# Patient Record
Sex: Female | Born: 1940 | Race: White | Hispanic: No | Marital: Married | Smoking: Never smoker
Health system: Southern US, Community
[De-identification: ages and names within clinical notes are randomized; demographics above are authoritative.]

## PROBLEM LIST (undated history)

## (undated) DIAGNOSIS — E785 Hyperlipidemia, unspecified: Secondary | ICD-10-CM

## (undated) DIAGNOSIS — K219 Gastro-esophageal reflux disease without esophagitis: Secondary | ICD-10-CM

## (undated) DIAGNOSIS — N189 Chronic kidney disease, unspecified: Secondary | ICD-10-CM

## (undated) DIAGNOSIS — M810 Age-related osteoporosis without current pathological fracture: Secondary | ICD-10-CM

## (undated) DIAGNOSIS — R413 Other amnesia: Principal | ICD-10-CM

## (undated) DIAGNOSIS — T8859XA Other complications of anesthesia, initial encounter: Secondary | ICD-10-CM

## (undated) DIAGNOSIS — T4145XA Adverse effect of unspecified anesthetic, initial encounter: Secondary | ICD-10-CM

## (undated) HISTORY — PX: TONSILLECTOMY: SUR1361

## (undated) HISTORY — DX: Other amnesia: R41.3

---

## 1982-12-03 HISTORY — PX: ABDOMINAL HYSTERECTOMY: SHX81

## 2000-01-29 ENCOUNTER — Other Ambulatory Visit: Admission: RE | Admit: 2000-01-29 | Discharge: 2000-01-29 | Payer: Self-pay | Admitting: Obstetrics and Gynecology

## 2000-08-12 ENCOUNTER — Encounter: Payer: Self-pay | Admitting: Obstetrics and Gynecology

## 2000-08-12 ENCOUNTER — Encounter: Admission: RE | Admit: 2000-08-12 | Discharge: 2000-08-12 | Payer: Self-pay | Admitting: Obstetrics and Gynecology

## 2001-01-29 ENCOUNTER — Other Ambulatory Visit: Admission: RE | Admit: 2001-01-29 | Discharge: 2001-01-29 | Payer: Self-pay | Admitting: Obstetrics and Gynecology

## 2001-07-29 ENCOUNTER — Ambulatory Visit (HOSPITAL_COMMUNITY): Admission: RE | Admit: 2001-07-29 | Discharge: 2001-07-29 | Payer: Self-pay | Admitting: Gastroenterology

## 2001-12-07 ENCOUNTER — Encounter: Payer: Self-pay | Admitting: Emergency Medicine

## 2001-12-07 ENCOUNTER — Emergency Department (HOSPITAL_COMMUNITY): Admission: EM | Admit: 2001-12-07 | Discharge: 2001-12-07 | Payer: Self-pay | Admitting: Emergency Medicine

## 2002-04-17 ENCOUNTER — Other Ambulatory Visit: Admission: RE | Admit: 2002-04-17 | Discharge: 2002-04-17 | Payer: Self-pay | Admitting: Gynecology

## 2003-06-25 ENCOUNTER — Encounter: Payer: Self-pay | Admitting: Gynecology

## 2003-06-25 ENCOUNTER — Encounter: Admission: RE | Admit: 2003-06-25 | Discharge: 2003-06-25 | Payer: Self-pay | Admitting: Gynecology

## 2004-06-13 ENCOUNTER — Other Ambulatory Visit: Admission: RE | Admit: 2004-06-13 | Discharge: 2004-06-13 | Payer: Self-pay | Admitting: Gynecology

## 2004-06-17 ENCOUNTER — Emergency Department (HOSPITAL_COMMUNITY): Admission: EM | Admit: 2004-06-17 | Discharge: 2004-06-17 | Payer: Self-pay | Admitting: Emergency Medicine

## 2004-06-22 ENCOUNTER — Ambulatory Visit (HOSPITAL_COMMUNITY): Admission: RE | Admit: 2004-06-22 | Discharge: 2004-06-22 | Payer: Self-pay | Admitting: Gastroenterology

## 2005-03-28 ENCOUNTER — Ambulatory Visit: Payer: Self-pay | Admitting: Family Medicine

## 2005-06-13 ENCOUNTER — Other Ambulatory Visit: Admission: RE | Admit: 2005-06-13 | Discharge: 2005-06-13 | Payer: Self-pay | Admitting: Gynecology

## 2005-08-07 ENCOUNTER — Ambulatory Visit: Payer: Self-pay | Admitting: Family Medicine

## 2006-02-14 ENCOUNTER — Ambulatory Visit: Payer: Self-pay | Admitting: Family Medicine

## 2006-02-16 ENCOUNTER — Emergency Department (HOSPITAL_COMMUNITY): Admission: EM | Admit: 2006-02-16 | Discharge: 2006-02-16 | Payer: Self-pay | Admitting: *Deleted

## 2006-04-16 ENCOUNTER — Ambulatory Visit: Payer: Self-pay | Admitting: Family Medicine

## 2006-04-22 ENCOUNTER — Ambulatory Visit (HOSPITAL_COMMUNITY): Admission: RE | Admit: 2006-04-22 | Discharge: 2006-04-22 | Payer: Self-pay | Admitting: Family Medicine

## 2006-05-14 ENCOUNTER — Ambulatory Visit: Payer: Self-pay | Admitting: Family Medicine

## 2006-06-04 ENCOUNTER — Ambulatory Visit: Payer: Self-pay | Admitting: Family Medicine

## 2006-06-10 ENCOUNTER — Ambulatory Visit (HOSPITAL_COMMUNITY): Admission: RE | Admit: 2006-06-10 | Discharge: 2006-06-10 | Payer: Self-pay | Admitting: Family Medicine

## 2006-06-27 ENCOUNTER — Ambulatory Visit: Payer: Self-pay | Admitting: Family Medicine

## 2006-07-15 ENCOUNTER — Ambulatory Visit: Payer: Self-pay | Admitting: Family Medicine

## 2006-08-19 ENCOUNTER — Ambulatory Visit: Payer: Self-pay | Admitting: Family Medicine

## 2006-09-27 ENCOUNTER — Ambulatory Visit: Payer: Self-pay | Admitting: Family Medicine

## 2006-10-04 ENCOUNTER — Ambulatory Visit: Payer: Self-pay | Admitting: Family Medicine

## 2006-11-07 ENCOUNTER — Ambulatory Visit: Payer: Self-pay | Admitting: Family Medicine

## 2006-12-31 ENCOUNTER — Ambulatory Visit: Payer: Self-pay | Admitting: Family Medicine

## 2007-03-03 ENCOUNTER — Ambulatory Visit: Payer: Self-pay | Admitting: Family Medicine

## 2007-04-17 ENCOUNTER — Ambulatory Visit: Payer: Self-pay | Admitting: Family Medicine

## 2007-07-10 ENCOUNTER — Other Ambulatory Visit: Admission: RE | Admit: 2007-07-10 | Discharge: 2007-07-10 | Payer: Self-pay | Admitting: Gynecology

## 2009-03-01 ENCOUNTER — Encounter: Admission: RE | Admit: 2009-03-01 | Discharge: 2009-03-01 | Payer: Self-pay | Admitting: Surgery

## 2009-03-02 ENCOUNTER — Encounter: Admission: RE | Admit: 2009-03-02 | Discharge: 2009-03-02 | Payer: Self-pay | Admitting: Surgery

## 2009-08-04 ENCOUNTER — Encounter: Admission: RE | Admit: 2009-08-04 | Discharge: 2009-08-04 | Payer: Self-pay | Admitting: Gynecology

## 2010-08-09 ENCOUNTER — Encounter: Admission: RE | Admit: 2010-08-09 | Discharge: 2010-08-09 | Payer: Self-pay | Admitting: Surgery

## 2011-04-20 NOTE — Op Note (Signed)
NAME:  Diane Wise, Diane Wise                         ACCOUNT NO.:  [id number]   MEDICAL RECORD NO.:  [id number]                   PATIENT TYPE:  AMB   LOCATION:  ENDO                                 FACILITY:  [facility]   PHYSICIAN:  James L. Malon Kindle., M.D.          DATE OF BIRTH:  Nov 27, 1941   DATE OF PROCEDURE:  06/22/2004  DATE OF DISCHARGE:                                 OPERATIVE REPORT   PROCEDURE:  Esophagogastroduodenoscopy.   ENDOSCOPIST:  Llana Aliment. Randa Evens, M.D.   MEDICATIONS:  Cetacaine spray, fentanyl 50 mcg and Versed 4 mg IV.   INDICATION:  Chest pain and vague epigastric pain refractory to Pepcid.   DESCRIPTION OF PROCEDURE:  Procedure had been explained to the patient and  consent obtained.  With the patient in the left lateral decubitus position,  the Olympus scope was inserted and advanced.  We were able to advance easily  into the stomach.  The pyloric channel was identified and passed.  The  duodenum including the bulb and second portion was seen well.  The duodenal  bulb was quite scarred but there were no active ulcerations and had the  appearance of a patient with previous duodenal ulcers.  The pyloric channel  was grossly normal.  The antrum and body of the stomach were normal and free  of ulceration.  The fundus and cardia were seen well on the retroflexed view  and appeared normal.  There was a 2- to 3-cm hiatal hernia with a ring at  the GE junction.  The distal and proximal esophagus was endoscopically  normal.  Scope was withdrawn.  The patient tolerated the procedure well,  maintained on low-flow oxygen and pulse oximetry throughout the procedure.   ASSESSMENT:  1. Scarred duodenal bulb.  2. Probable esophageal reflux could be the cause of the chest pain, 530.81.   PLAN:  We will give Aciphex daily, give reflux instructions.  She will see  Dr. Molly Maduro Buccini in the office in 3-4 weeks.                                               James L. Malon Kindle.,  M.D.    Waldron Session  D:  06/22/2004  T:  06/22/2004  Job:  [id number]   cc:   Colon Flattery, D.O.  [address]  [place]  [place] [address]  Fax: [phone]   Bernette Redbird, M.D.  [address] [place]  [place], [place] [address]  Fax: [phone]

## 2011-04-20 NOTE — Procedures (Signed)
[facility]  Patient:    CARMYN, HAMM Visit Number: [id number] MRN: [id number]          Service Type: END Location: ENDO Attending Physician:  Rich Brave Dictated by:   Florencia Reasons, M.D. Proc. Date: 07/29/01 Admit Date:  07/29/2001 Discharge Date: 07/29/2001                             Procedure Report  PROCEDURE PERFORMED:  Colonoscopy.  ENDOSCOPIST:  Florencia Reasons, M.D.  INDICATIONS FOR PROCEDURE:  Rectal bleeding.  FINDINGS:  Normal exam to the cecum.  DESCRIPTION OF PROCEDURE:  The nature, purpose and risks of the procedure had been discussed with the patient, who provided written consent.  Sedation was fentanyl 70 mcg and Versed 7 mg IV.  The colonoscope was advanced to the cecum and pullback was then performed.  No abnormalities were identified during this exam.  Specifically, I saw no evidence of polyps, cancer, colitis, vascular malformations or diverticulosis. No definite source of the patients rectal bleeding was identified.  There were no evident complications to the procedure.  IMPRESSION:  Normal colonoscopy.  PLAN: Follow-up colonoscopy anticipated in about five years. Dictated by:   Florencia Reasons, M.D. Attending Physician:  Rich Brave DD:  08/21/01 TD:  08/21/01 Job: [id number]

## 2011-04-26 ENCOUNTER — Encounter (INDEPENDENT_AMBULATORY_CARE_PROVIDER_SITE_OTHER): Payer: Self-pay | Admitting: Surgery

## 2011-06-27 ENCOUNTER — Other Ambulatory Visit (INDEPENDENT_AMBULATORY_CARE_PROVIDER_SITE_OTHER): Payer: Self-pay | Admitting: Surgery

## 2011-06-27 DIAGNOSIS — Z1231 Encounter for screening mammogram for malignant neoplasm of breast: Secondary | ICD-10-CM

## 2011-08-17 ENCOUNTER — Ambulatory Visit
Admission: RE | Admit: 2011-08-17 | Discharge: 2011-08-17 | Disposition: A | Discharge status: Home / Self Care | Payer: Medicare Other | Source: Ambulatory Visit | Attending: Surgery | Admitting: Surgery

## 2011-08-17 DIAGNOSIS — Z1231 Encounter for screening mammogram for malignant neoplasm of breast: Secondary | ICD-10-CM

## 2012-07-08 ENCOUNTER — Other Ambulatory Visit: Payer: Self-pay | Admitting: Gynecology

## 2012-07-08 DIAGNOSIS — Z1231 Encounter for screening mammogram for malignant neoplasm of breast: Secondary | ICD-10-CM

## 2012-08-15 ENCOUNTER — Ambulatory Visit
Admission: RE | Admit: 2012-08-15 | Discharge: 2012-08-15 | Disposition: A | Discharge status: Home / Self Care | Payer: Medicare Other | Source: Ambulatory Visit | Attending: Gynecology | Admitting: Gynecology

## 2012-08-15 DIAGNOSIS — Z1231 Encounter for screening mammogram for malignant neoplasm of breast: Secondary | ICD-10-CM

## 2013-02-13 ENCOUNTER — Encounter (INDEPENDENT_AMBULATORY_CARE_PROVIDER_SITE_OTHER): Payer: Self-pay | Admitting: General Surgery

## 2013-02-13 ENCOUNTER — Ambulatory Visit (INDEPENDENT_AMBULATORY_CARE_PROVIDER_SITE_OTHER): Payer: Medicare Other | Admitting: General Surgery

## 2013-02-13 VITALS — BP 118/64 | HR 85 | Temp 98.5°F | Resp 18 | Ht 64.0 in | Wt 136.8 lb

## 2013-02-13 DIAGNOSIS — K635 Polyp of colon: Secondary | ICD-10-CM

## 2013-02-13 DIAGNOSIS — D126 Benign neoplasm of colon, unspecified: Secondary | ICD-10-CM

## 2013-02-13 DIAGNOSIS — Z860100 Personal history of colon polyps, unspecified: Secondary | ICD-10-CM | POA: Insufficient documentation

## 2013-02-13 NOTE — Patient Instructions (Addendum)
We will schedule an appendectomy at your convenience.  Call the office when you are ready to schedule

## 2013-02-13 NOTE — Progress Notes (Signed)
Chief Complaint  Patient presents with  . New Evaluation    eval adenoma    HISTORY: Diane Wise is a 72 y.o. female who presents to the office with a cecal polyp at the appendiceal orifice.  Biopsy revealed tubular adenoma with no HGD. This was found on previous colonoscopy and resected then as well but was larger and still present on repeat biopsy.  She denies abd pain, melena and weight loss.  She had a hysterectomy in 1984, and she thinks they may have done an appendectomy at that time.  History reviewed. No pertinent past medical history.  High cholesterol  Past Surgical History  Procedure Laterality Date  . Tonsillectomy    . Abdominal hysterectomy  1984      Current Outpatient Prescriptions  Medication Sig Dispense Refill  . aspirin 325 MG EC tablet Take 325 mg by mouth daily.      . cyanocobalamin 100 MCG tablet Take 100 mcg by mouth daily.      . Flaxseed, Linseed, (FLAX SEED OIL PO) Take by mouth.      . Multiple Vitamin (MULTIVITAMIN) capsule Take 1 capsule by mouth daily.        Maxwell Caul Bicarbonate (ZEGERID) 20-1100 MG CAPS Take 1 capsule by mouth daily before breakfast.      . simvastatin (ZOCOR) 20 MG tablet Take 20 mg by mouth at bedtime.         No current facility-administered medications for this visit.      Allergies  Allergen Reactions  . Codeine Nausea And Vomiting  . Sulfa Antibiotics Nausea And Vomiting      Family History  Problem Relation Age of Onset  . Cancer Father   . Diabetes Sister   . Diabetes Brother       History   Social History  . Marital Status: Married    Spouse Name: N/A    Number of Children: N/A  . Years of Education: N/A   Social History Main Topics  . Smoking status: Never Smoker   . Smokeless tobacco: Never Used  . Alcohol Use: No  . Drug Use: None  . Sexually Active: None   Other Topics Concern  . None   Social History Narrative  . None       REVIEW OF SYSTEMS - PERTINENT POSITIVES  ONLY: Review of Systems - General ROS: negative for - chills or fever Hematological and Lymphatic ROS: negative for - bleeding problems, blood clots or bruising Respiratory ROS: no cough, shortness of breath, or wheezing Cardiovascular ROS: no chest pain or dyspnea on exertion Gastrointestinal ROS: no abdominal pain, change in bowel habits, or black or bloody stools  EXAM: Filed Vitals:   02/13/13 1525  BP: 118/64  Pulse: 85  Temp: 98.5 F (36.9 C)  Resp: 18    Gen:  No acute distress.  Well nourished and well groomed.   Neurological: Alert and oriented to person, place, and time. Coordination normal.  Head: Normocephalic and atraumatic.  Eyes: Conjunctivae are normal. Pupils are equal, round, and reactive to light. No scleral icterus.  Neck: Normal range of motion. Neck supple. No tracheal deviation or thyromegaly present.  No cervical lymphadenopathy. Cardiovascular: Normal rate, regular rhythm, normal heart sounds and intact distal pulses.  Exam reveals no gallop and no friction rub.  No murmur heard. Respiratory: Effort normal.  No respiratory distress. No chest wall tenderness. Breath sounds normal.  No wheezes, rales or rhonchi.  GI: Soft. Bowel sounds are normal.  The abdomen is soft and nontender.  There is no rebound and no guarding.  Musculoskeletal: Normal range of motion. Extremities are nontender.  Skin: Skin is warm and dry. No rash noted. No diaphoresis. No erythema. No pallor. No clubbing, cyanosis, or edema.   Psychiatric: Normal mood and affect. Behavior is normal. Judgment and thought content normal.     ASSESSMENT AND PLAN: Diane Wise is a 72 y.o. F with a polyp noted at appendiceal orifice.  This was not amendable to endoscopic resection due to location.  Her options are to continue endoscopic surveillance or surgical resection.  She has elected for surgical resection.  We will attempt to get records of hysterectomy and plan for l/s appendectomy at her  conveinence.  I will ask her medical doctor to provide preoperative clearance.   Vanita Panda, MD Colon and Rectal Surgery / General Surgery [facility]      Visit Diagnoses: 1. Personal history of colonic polyps     Primary Care Physician: Josue Hector, MD

## 2013-02-17 ENCOUNTER — Encounter (INDEPENDENT_AMBULATORY_CARE_PROVIDER_SITE_OTHER): Payer: Self-pay

## 2013-03-02 ENCOUNTER — Encounter (INDEPENDENT_AMBULATORY_CARE_PROVIDER_SITE_OTHER): Payer: Self-pay

## 2013-03-06 ENCOUNTER — Encounter (INDEPENDENT_AMBULATORY_CARE_PROVIDER_SITE_OTHER): Payer: Self-pay

## 2013-05-15 ENCOUNTER — Encounter (INDEPENDENT_AMBULATORY_CARE_PROVIDER_SITE_OTHER): Payer: Self-pay | Admitting: General Surgery

## 2013-05-15 ENCOUNTER — Ambulatory Visit (INDEPENDENT_AMBULATORY_CARE_PROVIDER_SITE_OTHER): Payer: Medicare Other | Admitting: General Surgery

## 2013-05-15 VITALS — BP 140/82 | HR 78 | Temp 97.9°F | Resp 16 | Ht 64.0 in | Wt 140.0 lb

## 2013-05-15 DIAGNOSIS — D126 Benign neoplasm of colon, unspecified: Secondary | ICD-10-CM

## 2013-05-15 DIAGNOSIS — K635 Polyp of colon: Secondary | ICD-10-CM

## 2013-05-15 NOTE — Progress Notes (Signed)
Chief Complaint  Patient presents with  . Routine Post Op    reck adenoma polyp    HISTORY:  Diane Wise is a 72 y.o. female who presents to the office with a cecal polyp at the appendiceal orifice. Biopsy revealed tubular adenoma with no HGD. This was found on previous colonoscopy and resected then as well but was larger and still present on repeat biopsy. She denies abd pain, melena and weight loss. She had a hysterectomy in 1984, and thought they may have done an appendectomy at that time, but we obtained these records, which indicated they did not. I discussed her case personally with Dr Matthias Hughs, and he was fairly sure that this was truly at the appendiceal orifice.  PMH: GERD, HLD   Past Surgical History  Procedure Laterality Date  . Tonsillectomy    . Abdominal hysterectomy  1984      Current Outpatient Prescriptions  Medication Sig Dispense Refill  . aspirin 325 MG EC tablet Take 325 mg by mouth daily.      . cyanocobalamin 100 MCG tablet Take 100 mcg by mouth daily.      . Flaxseed, Linseed, (FLAX SEED OIL PO) Take by mouth.      . fluticasone (FLONASE) 50 MCG/ACT nasal spray       . Multiple Vitamin (MULTIVITAMIN) capsule Take 1 capsule by mouth daily.        Diane Wise Bicarbonate (ZEGERID) 20-1100 MG CAPS Take 1 capsule by mouth daily before breakfast.      . simvastatin (ZOCOR) 20 MG tablet Take 20 mg by mouth at bedtime.         No current facility-administered medications for this visit.     Allergies  Allergen Reactions  . Codeine Nausea And Vomiting  . Sulfa Antibiotics Nausea And Vomiting      Family History  Problem Relation Age of Onset  . Cancer Father   . Diabetes Sister   . Diabetes Brother       History   Social History  . Marital Status: Married    Spouse Name: N/A    Number of Children: N/A  . Years of Education: N/A   Social History Main Topics  . Smoking status: Never Smoker   . Smokeless tobacco: Never Used  . Alcohol Use:  No  . Drug Use: None  . Sexually Active: None   Other Topics Concern  . None   Social History Narrative  . None       REVIEW OF SYSTEMS - PERTINENT POSITIVES ONLY: Review of Systems - General ROS: negative for - chills or fever Respiratory ROS: no cough, shortness of breath, or wheezing Cardiovascular ROS: no chest pain or dyspnea on exertion Gastrointestinal ROS: no abdominal pain, change in bowel habits, or black or bloody stools  EXAM: Filed Vitals:   05/15/13 1359  BP: 140/82  Pulse: 78  Temp: 97.9 F (36.6 C)  Resp: 16    General appearance: alert and cooperative Resp: clear to auscultation bilaterally Cardio: regular rate and rhythm GI: soft, non-tender; bowel sounds normal; no masses,  no organomegaly   ASSESSMENT AND PLAN: Diane Wise is a 72 y.o. 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  I think that an appendectomy with a cuff of cecum should remove this without much morbidity.  We discussed the risks of surgery and I answered all questions.  She has been cleared by her medical doctor as well.  We discussed the unlikely  scenario of not getting the polyp in the specimen.  If this occurs, I will close and have her undergo a repeat colonoscopy to locate and tattoo the lesion.  She is agreeable to this plan.      Vanita Panda, MD Colon and Rectal Surgery / General Surgery [facility]      Visit Diagnoses: 1. Polyp of colon     Primary Care Physician: Josue Hector, MD

## 2013-05-15 NOTE — Patient Instructions (Signed)
We will have you see Dr Lysbeth Galas for medical clearance.  We will plan on doing a laparoscopic appendectomy with overnight stay in the hospital

## 2013-05-18 ENCOUNTER — Encounter (HOSPITAL_COMMUNITY): Payer: Self-pay | Admitting: Pharmacy Technician

## 2013-05-19 ENCOUNTER — Other Ambulatory Visit (HOSPITAL_COMMUNITY): Payer: Self-pay | Admitting: *Deleted

## 2013-05-19 ENCOUNTER — Encounter (HOSPITAL_COMMUNITY)
Admission: RE | Admit: 2013-05-19 | Discharge: 2013-05-19 | Disposition: A | Discharge status: Home / Self Care | Payer: Medicare Other | Source: Ambulatory Visit | Attending: General Surgery | Admitting: General Surgery

## 2013-05-19 ENCOUNTER — Encounter (HOSPITAL_COMMUNITY): Payer: Self-pay

## 2013-05-19 HISTORY — DX: Adverse effect of unspecified anesthetic, initial encounter: T41.45XA

## 2013-05-19 HISTORY — DX: Other complications of anesthesia, initial encounter: T88.59XA

## 2013-05-19 HISTORY — DX: Gastro-esophageal reflux disease without esophagitis: K21.9

## 2013-05-19 HISTORY — DX: Age-related osteoporosis without current pathological fracture: M81.0

## 2013-05-19 LAB — CBC
HCT: 44.2 % (ref 36.0–46.0)
Hemoglobin: 14.8 g/dL (ref 12.0–15.0)
MCH: 29 pg (ref 26.0–34.0)
MCHC: 33.5 g/dL (ref 30.0–36.0)
MCV: 86.7 fL (ref 78.0–100.0)
Platelets: 225 10*3/uL (ref 150–400)
RBC: 5.1 MIL/uL (ref 3.87–5.11)
RDW: 13.2 % (ref 11.5–15.5)
WBC: 9 10*3/uL (ref 4.0–10.5)

## 2013-05-19 LAB — SURGICAL PCR SCREEN
MRSA, PCR: NEGATIVE
Staphylococcus aureus: NEGATIVE

## 2013-05-19 NOTE — Patient Instructions (Signed)
Diane Wise  05/19/2013                           YOUR PROCEDURE IS SCHEDULED ON: 05/20/13               PLEASE REPORT TO SHORT STAY CENTER AT :  9:30 AM               CALL THIS NUMBER IF ANY PROBLEMS THE DAY OF SURGERY :               [phone]                      REMEMBER:   Do not eat food or drink liquids AFTER MIDNIGHT    Take these medicines the morning of surgery with A SIP OF WATER: NONE   Do not wear jewelry, make-up   Do not wear lotions, powders, or perfumes.   Do not shave legs or underarms 12 hrs. before surgery (men may shave face)  Do not bring valuables to the hospital.  Contacts, dentures or bridgework may not be worn into surgery.  Leave suitcase in the car. After surgery it may be brought to your room.   Patients discharged the day of surgery will not be allowed to drive home                             If going home same day of surgery, must have someone stay with you first                           24 hrs at home and arrange for some one to drive you home from hospital.    Special Instructions:   Please read over the following fact sheets that you were given:               1. MRSA  INFORMATION                      2. [place] PREPARING FOR SURGERY SHEET                                                X_____________________________________________________________________        Failure to follow these instructions may result in cancellation of your surgery

## 2013-05-19 NOTE — Progress Notes (Signed)
05/19/13 1143  OBSTRUCTIVE SLEEP APNEA  Have you ever been diagnosed with sleep apnea through a sleep study? No  Do you snore loudly (loud enough to be heard through closed doors)?  1  Do you often feel tired, fatigued, or sleepy during the daytime? 1  Has anyone observed you stop breathing during your sleep? 0  Do you have, or are you being treated for high blood pressure? 0  BMI more than 35 kg/m2? 1  Age over 74 years old? 1  Neck circumference greater than 40 cm/18 inches? 0  Gender: 0  Obstructive Sleep Apnea Score 4  Score 4 or greater  Results sent to PCP

## 2013-05-20 ENCOUNTER — Encounter (HOSPITAL_COMMUNITY): Payer: Self-pay | Admitting: Anesthesiology

## 2013-05-20 ENCOUNTER — Ambulatory Visit (HOSPITAL_COMMUNITY): Payer: Medicare Other | Admitting: Anesthesiology

## 2013-05-20 ENCOUNTER — Encounter (HOSPITAL_COMMUNITY)
Admission: RE | Disposition: A | Discharge status: Home / Self Care | Payer: Self-pay | Source: Ambulatory Visit | Attending: General Surgery

## 2013-05-20 ENCOUNTER — Ambulatory Visit (HOSPITAL_COMMUNITY)
Admission: RE | Admit: 2013-05-20 | Discharge: 2013-05-21 | Disposition: A | Discharge status: Home / Self Care | Payer: Medicare Other | Source: Ambulatory Visit | Attending: General Surgery | Admitting: General Surgery

## 2013-05-20 ENCOUNTER — Encounter (HOSPITAL_COMMUNITY): Payer: Self-pay | Admitting: *Deleted

## 2013-05-20 DIAGNOSIS — Z01812 Encounter for preprocedural laboratory examination: Secondary | ICD-10-CM | POA: Insufficient documentation

## 2013-05-20 DIAGNOSIS — Z7982 Long term (current) use of aspirin: Secondary | ICD-10-CM | POA: Insufficient documentation

## 2013-05-20 DIAGNOSIS — Z79899 Other long term (current) drug therapy: Secondary | ICD-10-CM | POA: Insufficient documentation

## 2013-05-20 DIAGNOSIS — D126 Benign neoplasm of colon, unspecified: Secondary | ICD-10-CM | POA: Insufficient documentation

## 2013-05-20 DIAGNOSIS — Z86010 Personal history of colonic polyps: Secondary | ICD-10-CM

## 2013-05-20 HISTORY — PX: LAPAROSCOPIC APPENDECTOMY: SHX408

## 2013-05-20 HISTORY — PX: APPENDECTOMY: SHX54

## 2013-05-20 SURGERY — APPENDECTOMY, LAPAROSCOPIC
Anesthesia: General | Site: Abdomen | Wound class: Clean Contaminated

## 2013-05-20 MED ORDER — PANTOPRAZOLE SODIUM 40 MG PO TBEC
40.0000 mg | DELAYED_RELEASE_TABLET | Freq: Every day | ORAL | Status: DC
  Administered 2013-05-20 – 2013-05-21 (×2): 40 mg via ORAL
  Filled 2013-05-20 (×2): qty 1

## 2013-05-20 MED ORDER — SODIUM CHLORIDE 0.9 % IV SOLN
INTRAVENOUS | Status: DC
  Administered 2013-05-20: 16:00:00 via INTRAVENOUS

## 2013-05-20 MED ORDER — SODIUM CHLORIDE 0.9 % IR SOLN
Status: DC | PRN
  Administered 2013-05-20: 1000 mL

## 2013-05-20 MED ORDER — LACTATED RINGERS IV SOLN
INTRAVENOUS | Status: DC
  Administered 2013-05-20: 1000 mL via INTRAVENOUS

## 2013-05-20 MED ORDER — MORPHINE SULFATE 2 MG/ML IJ SOLN: 2.0000 mg | INTRAMUSCULAR | Status: DC | PRN

## 2013-05-20 MED ORDER — SIMVASTATIN 20 MG PO TABS
20.0000 mg | ORAL_TABLET | Freq: Every day | ORAL | Status: DC
  Administered 2013-05-20: 20 mg via ORAL
  Filled 2013-05-20 (×2): qty 1

## 2013-05-20 MED ORDER — DEXTROSE 5 % IV SOLN: 2.0000 g | INTRAVENOUS | Status: DC

## 2013-05-20 MED ORDER — MIDAZOLAM HCL 5 MG/5ML IJ SOLN
INTRAMUSCULAR | Status: DC | PRN
  Administered 2013-05-20: 1 mg via INTRAVENOUS

## 2013-05-20 MED ORDER — DEXTROSE 5 % IV SOLN
1.0000 g | Freq: Four times a day (QID) | INTRAVENOUS | Status: AC
  Administered 2013-05-20: 1 g via INTRAVENOUS
  Filled 2013-05-20: qty 1

## 2013-05-20 MED ORDER — ROCURONIUM BROMIDE 100 MG/10ML IV SOLN
INTRAVENOUS | Status: DC | PRN
  Administered 2013-05-20: 40 mg via INTRAVENOUS

## 2013-05-20 MED ORDER — NEOSTIGMINE METHYLSULFATE 1 MG/ML IJ SOLN
INTRAMUSCULAR | Status: DC | PRN
  Administered 2013-05-20: 3 mg via INTRAVENOUS

## 2013-05-20 MED ORDER — BUPIVACAINE-EPINEPHRINE (PF) 0.5% -1:200000 IJ SOLN
INTRAMUSCULAR | Status: AC
  Filled 2013-05-20: qty 10

## 2013-05-20 MED ORDER — ONDANSETRON HCL 4 MG/2ML IJ SOLN: 4.0000 mg | Freq: Four times a day (QID) | INTRAMUSCULAR | Status: DC | PRN

## 2013-05-20 MED ORDER — CEFOXITIN SODIUM-DEXTROSE 1-4 GM-% IV SOLR (PREMIX)
INTRAVENOUS | Status: AC
  Filled 2013-05-20: qty 100

## 2013-05-20 MED ORDER — RINGERS IRRIGATION IR SOLN
Status: DC | PRN
  Administered 2013-05-20: 1000 mL

## 2013-05-20 MED ORDER — FLUTICASONE PROPIONATE 50 MCG/ACT NA SUSP
1.0000 | Freq: Every day | NASAL | Status: DC
  Filled 2013-05-20: qty 16

## 2013-05-20 MED ORDER — DEXAMETHASONE SODIUM PHOSPHATE 4 MG/ML IJ SOLN
INTRAMUSCULAR | Status: DC | PRN
  Administered 2013-05-20: 10 mg via INTRAVENOUS

## 2013-05-20 MED ORDER — PROMETHAZINE HCL 25 MG/ML IJ SOLN: 6.2500 mg | INTRAMUSCULAR | Status: DC | PRN

## 2013-05-20 MED ORDER — PROPOFOL 10 MG/ML IV BOLUS
INTRAVENOUS | Status: DC | PRN
  Administered 2013-05-20: 120 mg via INTRAVENOUS

## 2013-05-20 MED ORDER — ACETAMINOPHEN 325 MG PO TABS: 650.0000 mg | ORAL_TABLET | ORAL | Status: DC | PRN

## 2013-05-20 MED ORDER — ONDANSETRON HCL 4 MG PO TABS: 4.0000 mg | ORAL_TABLET | Freq: Four times a day (QID) | ORAL | Status: DC | PRN

## 2013-05-20 MED ORDER — FENTANYL CITRATE 0.05 MG/ML IJ SOLN
INTRAMUSCULAR | Status: DC | PRN
  Administered 2013-05-20 (×4): 50 ug via INTRAVENOUS

## 2013-05-20 MED ORDER — DEXTROSE 5 % IV SOLN
2.0000 g | INTRAVENOUS | Status: AC
  Administered 2013-05-20: 2 g via INTRAVENOUS
  Filled 2013-05-20: qty 2

## 2013-05-20 MED ORDER — GLYCOPYRROLATE 0.2 MG/ML IJ SOLN
INTRAMUSCULAR | Status: DC | PRN
  Administered 2013-05-20: .4 mg via INTRAVENOUS

## 2013-05-20 MED ORDER — HYDROMORPHONE HCL PF 1 MG/ML IJ SOLN
0.2500 mg | INTRAMUSCULAR | Status: DC | PRN
  Administered 2013-05-20 (×2): 0.25 mg via INTRAVENOUS

## 2013-05-20 MED ORDER — HYDROCODONE-ACETAMINOPHEN 5-325 MG PO TABS
1.0000 | ORAL_TABLET | ORAL | Status: DC | PRN
  Administered 2013-05-20 – 2013-05-21 (×3): 1 via ORAL
  Administered 2013-05-21: 2 via ORAL
  Filled 2013-05-20: qty 1
  Filled 2013-05-20: qty 2
  Filled 2013-05-20: qty 1
  Filled 2013-05-20: qty 2
  Filled 2013-05-20: qty 1

## 2013-05-20 MED ORDER — ONDANSETRON HCL 4 MG/2ML IJ SOLN
INTRAMUSCULAR | Status: DC | PRN
  Administered 2013-05-20: 4 mg via INTRAVENOUS

## 2013-05-20 MED ORDER — ENOXAPARIN SODIUM 40 MG/0.4ML ~~LOC~~ SOLN
40.0000 mg | SUBCUTANEOUS | Status: DC
  Administered 2013-05-21: 40 mg via SUBCUTANEOUS
  Filled 2013-05-20: qty 0.4

## 2013-05-20 MED ORDER — HYDROMORPHONE HCL PF 1 MG/ML IJ SOLN
INTRAMUSCULAR | Status: AC
  Filled 2013-05-20: qty 1

## 2013-05-20 SURGICAL SUPPLY — 46 items
APPLIER CLIP 5 13 M/L LIGAMAX5 (MISCELLANEOUS)
APPLIER CLIP ROT 10 11.4 M/L (STAPLE)
APPLIER CLIP UNV 5X34 EPIX (ENDOMECHANICALS) ×2 IMPLANT
APR CLP MED LRG 11.4X10 (STAPLE)
APR CLP MED LRG 5 ANG JAW (MISCELLANEOUS)
APR XCLPCLP 20M/L UNV 34X5 (ENDOMECHANICALS) ×1
BAG SPEC RTRVL LRG 6X4 10 (ENDOMECHANICALS) ×1
CANISTER SUCTION 2500CC (MISCELLANEOUS) ×2 IMPLANT
CHLORAPREP W/TINT 26ML (MISCELLANEOUS) ×2 IMPLANT
CLIP APPLIE 5 13 M/L LIGAMAX5 (MISCELLANEOUS) IMPLANT
CLIP APPLIE ROT 10 11.4 M/L (STAPLE) IMPLANT
CLOTH BEACON ORANGE TIMEOUT ST (SAFETY) ×2 IMPLANT
CORD HIGH FREQUENCY UNIPOLAR (ELECTROSURGICAL) ×2 IMPLANT
CUTTER FLEX LINEAR 45M (STAPLE) IMPLANT
DECANTER SPIKE VIAL GLASS SM (MISCELLANEOUS) IMPLANT
DERMABOND ADVANCED (GAUZE/BANDAGES/DRESSINGS) ×1
DERMABOND ADVANCED .7 DNX12 (GAUZE/BANDAGES/DRESSINGS) ×1 IMPLANT
DRAPE LAPAROSCOPIC ABDOMINAL (DRAPES) ×2 IMPLANT
DRAPE UTILITY W/TAPE 26X15 (DRAPES) ×2 IMPLANT
DRAPE UTILITY XL STRL (DRAPES) ×2 IMPLANT
ELECT REM PT RETURN 9FT ADLT (ELECTROSURGICAL) ×2
ELECTRODE REM PT RTRN 9FT ADLT (ELECTROSURGICAL) ×1 IMPLANT
GLOVE BIO SURGEON STRL SZ 6.5 (GLOVE) ×2 IMPLANT
GLOVE BIOGEL PI IND STRL 7.0 (GLOVE) ×1 IMPLANT
GLOVE BIOGEL PI INDICATOR 7.0 (GLOVE) ×1
GOWN STRL REIN 2XL XLG LVL4 (GOWN DISPOSABLE) ×2 IMPLANT
GOWN STRL REIN XL XLG (GOWN DISPOSABLE) ×4 IMPLANT
HANDLE STAPLE EGIA 4 XL (STAPLE) ×2 IMPLANT
IV LACTATED RINGERS 1000ML (IV SOLUTION) ×2 IMPLANT
KIT BASIN OR (CUSTOM PROCEDURE TRAY) ×2 IMPLANT
POUCH SPECIMEN RETRIEVAL 10MM (ENDOMECHANICALS) ×2 IMPLANT
RELOAD 45 VASCULAR/THIN (ENDOMECHANICALS) IMPLANT
RELOAD EGIA 60 MED/THCK PURPLE (STAPLE) ×2 IMPLANT
RELOAD EGIA 60 TAN VASC (STAPLE) ×2 IMPLANT
RELOAD STAPLE TA45 3.5 REG BLU (ENDOMECHANICALS) IMPLANT
SCALPEL HARMONIC ACE (MISCELLANEOUS) IMPLANT
SET IRRIG TUBING LAPAROSCOPIC (IRRIGATION / IRRIGATOR) ×2 IMPLANT
SOLUTION ANTI FOG 6CC (MISCELLANEOUS) ×2 IMPLANT
SUT MNCRL AB 4-0 PS2 18 (SUTURE) ×2 IMPLANT
TOWEL OR 17X26 10 PK STRL BLUE (TOWEL DISPOSABLE) ×2 IMPLANT
TRAY FOLEY CATH 14FRSI W/METER (CATHETERS) ×2 IMPLANT
TRAY LAP CHOLE (CUSTOM PROCEDURE TRAY) ×2 IMPLANT
TROCAR BLADELESS OPT 5 75 (ENDOMECHANICALS) ×4 IMPLANT
TROCAR XCEL 12X100 BLDLESS (ENDOMECHANICALS) IMPLANT
TROCAR XCEL BLUNT TIP 100MML (ENDOMECHANICALS) ×2 IMPLANT
TUBING INSUFFLATION 10FT LAP (TUBING) ×2 IMPLANT

## 2013-05-20 NOTE — Preoperative (Signed)
Beta Blockers   Reason not to administer Beta Blockers:Not Applicable 

## 2013-05-20 NOTE — Anesthesia Postprocedure Evaluation (Signed)
  Anesthesia Post-op Note  Patient: Diane Wise  Procedure(s) Performed: Procedure(s) (LRB): APPENDECTOMY LAPAROSCOPIC (N/A)  Patient Location: PACU  Anesthesia Type: General  Level of Consciousness: awake and alert   Airway and Oxygen Therapy: Patient Spontanous Breathing  Post-op Pain: mild  Post-op Assessment: Post-op Vital signs reviewed, Patient's Cardiovascular Status Stable, Respiratory Function Stable, Patent Airway and No signs of Nausea or vomiting  Last Vitals:  Filed Vitals:   05/20/13 1445  BP: 127/80  Pulse: 70  Temp: 36.4 C  Resp: 18    Post-op Vital Signs: stable   Complications: No apparent anesthesia complications

## 2013-05-20 NOTE — Transfer of Care (Signed)
Immediate Anesthesia Transfer of Care Note  Patient: Diane Wise  Procedure(s) Performed: Procedure(s): APPENDECTOMY LAPAROSCOPIC (N/A)  Patient Location: PACU  Anesthesia Type:General  Level of Consciousness: awake and alert   Airway & Oxygen Therapy: Patient Spontanous Breathing and Patient connected to face mask oxygen  Post-op Assessment: Report given to PACU RN and Post -op Vital signs reviewed and stable  Post vital signs: Reviewed and stable  Complications: No apparent anesthesia complications

## 2013-05-20 NOTE — Anesthesia Preprocedure Evaluation (Signed)
Anesthesia Evaluation  Patient identified by MRN, date of birth, ID band Patient awake    Reviewed: Allergy & Precautions, H&P , NPO status , Patient's Chart, lab work & pertinent test results  Airway Mallampati: II TM Distance: >3 FB Neck ROM: Full    Dental no notable dental hx.    Pulmonary neg pulmonary ROS,  breath sounds clear to auscultation  Pulmonary exam normal       Cardiovascular Exercise Tolerance: Good negative cardio ROS  Rhythm:Regular Rate:Normal     Neuro/Psych negative neurological ROS  negative psych ROS   GI/Hepatic negative GI ROS, Neg liver ROS,   Endo/Other  negative endocrine ROS  Renal/GU negative Renal ROS  negative genitourinary   Musculoskeletal negative musculoskeletal ROS (+)   Abdominal   Peds negative pediatric ROS (+)  Hematology negative hematology ROS (+)   Anesthesia Other Findings   Reproductive/Obstetrics negative OB ROS                           Anesthesia Physical Anesthesia Plan  ASA: II  Anesthesia Plan: General   Post-op Pain Management:    Induction: Intravenous  Airway Management Planned: Oral ETT  Additional Equipment:   Intra-op Plan:   Post-operative Plan: Extubation in OR  Informed Consent: I have reviewed the patients History and Physical, chart, labs and discussed the procedure including the risks, benefits and alternatives for the proposed anesthesia with the patient or authorized representative who has indicated his/her understanding and acceptance.   Dental advisory given  Plan Discussed with: CRNA  Anesthesia Plan Comments:         Anesthesia Quick Evaluation  

## 2013-05-20 NOTE — H&P (View-Only) (Signed)
Chief Complaint  Patient presents with  . Routine Post Op    reck adenoma polyp    HISTORY:  Diane Wise is a 71 y.o. female who presents to the office with a cecal polyp at the appendiceal orifice. Biopsy revealed tubular adenoma with no HGD. This was found on previous colonoscopy and resected then as well but was larger and still present on repeat biopsy. She denies abd pain, melena and weight loss. She had a hysterectomy in 1984, and thought they may have done an appendectomy at that time, but we obtained these records, which indicated they did not. I discussed her case personally with Dr Buccini, and he was fairly sure that this was truly at the appendiceal orifice.  PMH: GERD, HLD   Past Surgical History  Procedure Laterality Date  . Tonsillectomy    . Abdominal hysterectomy  1984      Current Outpatient Prescriptions  Medication Sig Dispense Refill  . aspirin 325 MG EC tablet Take 325 mg by mouth daily.      . cyanocobalamin 100 MCG tablet Take 100 mcg by mouth daily.      . Flaxseed, Linseed, (FLAX SEED OIL PO) Take by mouth.      . fluticasone (FLONASE) 50 MCG/ACT nasal spray       . Multiple Vitamin (MULTIVITAMIN) capsule Take 1 capsule by mouth daily.        . Omeprazole-Sodium Bicarbonate (ZEGERID) 20-1100 MG CAPS Take 1 capsule by mouth daily before breakfast.      . simvastatin (ZOCOR) 20 MG tablet Take 20 mg by mouth at bedtime.         No current facility-administered medications for this visit.     Allergies  Allergen Reactions  . Codeine Nausea And Vomiting  . Sulfa Antibiotics Nausea And Vomiting      Family History  Problem Relation Age of Onset  . Cancer Father   . Diabetes Sister   . Diabetes Brother       History   Social History  . Marital Status: Married    Spouse Name: N/A    Number of Children: N/A  . Years of Education: N/A   Social History Main Topics  . Smoking status: Never Smoker   . Smokeless tobacco: Never Used  . Alcohol Use:  No  . Drug Use: None  . Sexually Active: None   Other Topics Concern  . None   Social History Narrative  . None       REVIEW OF SYSTEMS - PERTINENT POSITIVES ONLY: Review of Systems - General ROS: negative for - chills or fever Respiratory ROS: no cough, shortness of breath, or wheezing Cardiovascular ROS: no chest pain or dyspnea on exertion Gastrointestinal ROS: no abdominal pain, change in bowel habits, or black or bloody stools  EXAM: Filed Vitals:   05/15/13 1359  BP: 140/82  Pulse: 78  Temp: 97.9 F (36.6 C)  Resp: 16    General appearance: alert and cooperative Resp: clear to auscultation bilaterally Cardio: regular rate and rhythm GI: soft, non-tender; bowel sounds normal; no masses,  no organomegaly   ASSESSMENT AND PLAN: Diane Wise is a 71 y.o. 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  I think that an appendectomy with a cuff of cecum should remove this without much morbidity.  We discussed the risks of surgery and I answered all questions.  She has been cleared by her medical doctor as well.  We discussed the unlikely   scenario of not getting the polyp in the specimen.  If this occurs, I will close and have her undergo a repeat colonoscopy to locate and tattoo the lesion.  She is agreeable to this plan.      Alicia C Thomas, MD Colon and Rectal Surgery / General Surgery [facility]      Visit Diagnoses: 1. Polyp of colon     Primary Care Physician: NYLAND,LEONARD ROBERT, MD     

## 2013-05-20 NOTE — Interval H&P Note (Signed)
History and Physical Interval Note:  05/20/2013 10:56 AM  Diane Wise  has presented today for surgery, with the diagnosis of cecal polyp  The various methods of treatment have been discussed with the patient and family. After consideration of risks, benefits and other options for treatment, the patient has consented to  Procedure(s): APPENDECTOMY LAPAROSCOPIC (N/A) as a surgical intervention .  The patient's history has been reviewed, patient examined, no change in status, stable for surgery.  I have reviewed the patient's chart and labs.  Questions were answered to the patient's satisfaction.   I once again discussed risk of surgery, which are mainly bleeding, infection, damage to adjacent structures, recurrence and/or inability to find the polyp.    Vanita Panda, MD  Colorectal and General Surgery [facility]

## 2013-05-20 NOTE — Op Note (Signed)
Genny T Granade [id number]   PRE-OPERATIVE DIAGNOSIS:  Cecal polyp at appendiceal orifice  POST-OPERATIVE DIAGNOSIS:  same  PROCEDURE:  Procedure(s): APPENDECTOMY LAPAROSCOPIC  SURGEON:  Surgeon(s): Romie Levee, MD  ASSISTANT: none   ANESTHESIA:   local and general  EBL:   50ml  Delay start of Pharmacological VTE agent (>24hrs) due to surgical blood loss or risk of bleeding:  no  DRAINS: none   SPECIMEN:  Source of Specimen:  appendix  DISPOSITION OF SPECIMEN:  PATHOLOGY  COUNTS:  YES  PLAN OF CARE: Admit for overnight observation  PATIENT DISPOSITION:  PACU - hemodynamically stable.   INDICATIONS: Patient with concerning symptoms & work up suspicious for appendicitis.  Surgery was recommended:  The anatomy & physiology of the digestive tract was discussed.  The pathophysiology of appendicitis was discussed.  Natural history risks without surgery was discussed.   I feel the risks of no intervention will lead to serious problems that outweigh the operative risks; therefore, I recommended diagnostic laparoscopy with removal of appendix to remove the pathology.  Laparoscopic & open techniques were discussed.   I noted a good likelihood this will help address the problem.    Risks such as bleeding, infection, abscess, leak, reoperation, possible ostomy, hernia, heart attack, death, and other risks were discussed.  Goals of post-operative recovery were discussed as well.  We will work to minimize complications.  Questions were answered.  The patient expresses understanding & wishes to proceed with surgery.  OR FINDINGS: appendix, normal appearing externally  DESCRIPTION:   The patient was identified & brought into the operating room. The patient was positioned supine with left arm tucked. SCDs were active during the entire case. The patient underwent general anesthesia without any difficulty.  A foley catheter was inserted under sterile conditions. The abdomen was prepped  and draped in a sterile fashion. A Surgical Timeout confirmed our plan.   I made a transverse incision through the inferior umbilical fold.  I made an incision in the infraumbilical fascia and confirmed peritoneal entry.  I placed a stay suture and then the [facility] port.  We induced carbon dioxide insufflation.  Camera inspection revealed no injury.  I placed additional ports under direct laparoscopic visualization.  I mobilized the proximal ascending colon in a lateral to medial fashion and identified the terminal ileum below the appendix.  I took care to avoid injuring any retroperitoneal structures.  I freed the appendix off its attachments to the ascending colon and cecal mesentery.  I elevated the appendix. I skeletonized & ligated the mesoappendix using a vascular load stapler. I was able to free off the base of the appendix.  I stapled the appendix off the cecum using a laparoscopic stapler (purple load). I took a healthy cuff viable cecum.  I placed the appendix inside an EndoCatch bag and removed out the [place] port.  I opened the appendix on the back table, where a small polyp was seen.    I did copious irrigation. Hemostasis was good in the mesoappendix, colon mesentery, and retroperitoneum. Staple line was intact on the cecum with bleeding noted.  This was controlled with hemoclips. I washed out the right paracolic gutter.  Hemostasis was good at this point. There was no perforation or injury.   I aspirated the carbon dioxide. I removed the ports. I closed the umbilical fascia site using a 0 Vicryl stitch. I closed skin using 4-0 vicryl stitch.  Sterile dressings were applied.  Patient was extubated and sent to the  recovery room.  I discussed the operative findings with the patient's family.

## 2013-05-21 ENCOUNTER — Encounter (HOSPITAL_COMMUNITY): Payer: Self-pay | Admitting: General Surgery

## 2013-05-21 LAB — BASIC METABOLIC PANEL
BUN: 15 mg/dL (ref 6–23)
CO2: 25 mEq/L (ref 19–32)
Calcium: 9.1 mg/dL (ref 8.4–10.5)
Chloride: 103 mEq/L (ref 96–112)
Creatinine, Ser: 0.75 mg/dL (ref 0.50–1.10)
GFR calc Af Amer: >90 mL/min (ref >90)
GFR calc non Af Amer: 82 mL/min — ABNORMAL LOW (ref >90)
Glucose, Bld: 148 mg/dL — ABNORMAL HIGH (ref 70–99)
Potassium: 4.9 mEq/L (ref 3.5–5.1)
Sodium: 137 mEq/L (ref 135–145)

## 2013-05-21 LAB — CBC
HCT: 40.7 % (ref 36.0–46.0)
Hemoglobin: 13.3 g/dL (ref 12.0–15.0)
MCH: 28 pg (ref 26.0–34.0)
MCHC: 32.7 g/dL (ref 30.0–36.0)
MCV: 85.7 fL (ref 78.0–100.0)
Platelets: 214 10*3/uL (ref 150–400)
RBC: 4.75 MIL/uL (ref 3.87–5.11)
RDW: 13.3 % (ref 11.5–15.5)
WBC: 18.1 10*3/uL — ABNORMAL HIGH (ref 4.0–10.5)

## 2013-05-21 MED ORDER — ACETAMINOPHEN 325 MG PO TABS: 650.0000 mg | ORAL_TABLET | ORAL | Status: AC | PRN

## 2013-05-21 MED ORDER — HYDROCODONE-ACETAMINOPHEN 5-325 MG PO TABS: 1.0000 | ORAL_TABLET | ORAL | Status: DC | PRN

## 2013-05-21 NOTE — Progress Notes (Signed)
Patient discharged per MD order. Discharge instructions reviewed with patient and husband at bedside. All questions answered. One prescription given to patient. Patient wheeled to the car via wheelchair for discharge home. Angelena Form, RN

## 2013-05-21 NOTE — Discharge Summary (Signed)
Physician Discharge Summary  Patient ID: Diane Wise MRN: [id number] DOB/AGE: 07-Aug-1941 72 y.o.  Admit date: 05/20/2013 Discharge date: 05/21/2013  Admission Diagnoses:  Discharge Diagnoses:  Active Problems:   * No active hospital problems. *   Discharged Condition: good  Hospital Course: The patient did well overnight after lap appendectomy.    Consults: None  Significant Diagnostic Studies: none  Treatments: IV hydration  Discharge Exam: Blood pressure 118/60, pulse 84, temperature 99.1 F (37.3 C), temperature source Oral, resp. rate 16, height 5\' 4"  (1.626 m), weight 139 lb (63.05 kg), SpO2 98.00%. General appearance: alert and cooperative GI: soft, non-tender; bowel sounds normal; no masses,  no organomegaly Incision/Wound: clean, dry, intact  Disposition:Home  Discharge Orders   Future Appointments Provider Department Dept Phone   06/08/2013 11:50 AM Romie Levee, MD [facility], [place] [phone]   Future Orders Complete By Expires     Discharge patient  As directed         Medication List    TAKE these medications       acetaminophen 325 MG tablet  Commonly known as:  TYLENOL  Take 2 tablets (650 mg total) by mouth every 4 (four) hours as needed.     aspirin EC 81 MG tablet  Take 81 mg by mouth every 6 (six) hours as needed for pain.     cyanocobalamin 100 MCG tablet  Take 100 mcg by mouth daily.     FLAX SEED OIL PO  Take 1 tablet by mouth daily.     fluticasone 50 MCG/ACT nasal spray  Commonly known as:  FLONASE  Place 1 spray into the nose daily.     HYDROcodone-acetaminophen 5-325 MG per tablet  Commonly known as:  NORCO/VICODIN  Take 1-2 tablets by mouth every 4 (four) hours as needed.     multivitamin capsule  Take 1 capsule by mouth daily.     Omeprazole-Sodium Bicarbonate 20-1100 MG Caps  Commonly known as:  ZEGERID  Take 1 capsule by mouth daily before breakfast.     simvastatin 20 MG tablet  Commonly known as:   ZOCOR  Take 20 mg by mouth at bedtime.     sodium chloride 0.65 % nasal spray  Commonly known as:  OCEAN  Place 1 spray into the nose daily as needed for congestion.         Signed: THOMAS, ALICIA C. 05/21/2013, 9:42 AM

## 2013-06-08 ENCOUNTER — Telehealth (INDEPENDENT_AMBULATORY_CARE_PROVIDER_SITE_OTHER): Payer: Self-pay

## 2013-06-08 ENCOUNTER — Ambulatory Visit (INDEPENDENT_AMBULATORY_CARE_PROVIDER_SITE_OTHER): Payer: Medicare Other | Admitting: General Surgery

## 2013-06-08 ENCOUNTER — Encounter (INDEPENDENT_AMBULATORY_CARE_PROVIDER_SITE_OTHER): Payer: Self-pay | Admitting: General Surgery

## 2013-06-08 VITALS — BP 128/84 | HR 84 | Temp 96.2°F | Ht 64.0 in | Wt 139.2 lb

## 2013-06-08 DIAGNOSIS — Z9889 Other specified postprocedural states: Secondary | ICD-10-CM

## 2013-06-08 DIAGNOSIS — K635 Polyp of colon: Secondary | ICD-10-CM

## 2013-06-08 NOTE — Telephone Encounter (Signed)
I called the pt.  I told her Dr Maisie Fus and Dr Matthias Hughs spoke.  He wants to see her back in a couple months and then plan colonoscopy.  I will refer her back to Dr Matthias Hughs.  She wants to see him the 1st of September and then schedule colonoscopy for October.

## 2013-06-08 NOTE — Progress Notes (Signed)
Diane Wise is a 72 y.o. female who is status post a lap appendectomy on 6/18 for an appendiceal orifice polyp.  She is doing well.  She is still having some pain but not taking any narcotics.  Her bowels are moving well.  Objective: Filed Vitals:   06/08/13 1201  BP: 128/84  Pulse: 84  Temp: 96.2 F (35.7 C)    General appearance: alert and cooperative GI: normal findings: soft, non-tender  Incision: healing well   Assessment: s/p  Patient Active Problem List   Diagnosis Date Noted  . Personal history of colonic polyps 02/13/2013    Plan: Her pathology showed no HGD but the polyp was involved in the transected margin.  I think she should undergo repeat colonoscopy in a few months to evaluate for residual polyp.  I have discussed this with Dr Diane Wise, and he agrees.      Diane Panda, MD [facility], [place] [phone]   06/08/2013 12:34 PM

## 2013-06-08 NOTE — Patient Instructions (Signed)
We will call about our decision on colonoscopy as soon as possible

## 2013-06-08 NOTE — Telephone Encounter (Signed)
Message copied by Ivory Broad on Mon Jun 08, 2013  3:54 PM ------      Message from: [place], Broadus John      Created: Mon Jun 08, 2013  1:00 PM      Regarding: f/u       Please let her know that Dr Matthias Hughs will see her in a couple months and plans to do a repeat colonoscopy in the fall to evaluate for residual polyp.  Can you also refer her back to his office to set up an apt for a couple months from now? ------

## 2013-07-08 ENCOUNTER — Telehealth (INDEPENDENT_AMBULATORY_CARE_PROVIDER_SITE_OTHER): Payer: Self-pay

## 2013-07-08 NOTE — Telephone Encounter (Signed)
No concerns.  Will await colonoscopy results.  Unlikely that she'd get an infection this far out.

## 2013-07-08 NOTE — Telephone Encounter (Signed)
Patient called in stating she had a few questions she didn't ask Dr Maisie Fus at her follow up visit back in July. She wanted to know what symptoms she would have if she had an infection. Advised she would have fever, chills, redness and or swelling at incision site. She states she has had low grade fever but thinks it is from a sinus infection she had. She is still having pain at umbilical area. Explained she could have discomfort there for quit some time. She was examined by Dr Matthias Hughs yesterday and he thinks she is doing fine. He has no concerns with her post op course. Patient did not want to wait 9 months for repeat colonoscopy as she is anxious so she is scheduled for one on 9/10. She would like a return call only if Dr Maisie Fus has any concerns.

## 2013-07-10 NOTE — Telephone Encounter (Signed)
Patient called to see when she can get on the Elyptical machine.  I told her to wait for 8 weeks after surgery.

## 2013-07-15 ENCOUNTER — Other Ambulatory Visit: Payer: Self-pay

## 2013-07-15 DIAGNOSIS — Z1231 Encounter for screening mammogram for malignant neoplasm of breast: Secondary | ICD-10-CM

## 2013-08-12 ENCOUNTER — Other Ambulatory Visit: Payer: Self-pay | Admitting: Gastroenterology

## 2013-08-20 ENCOUNTER — Ambulatory Visit
Admission: RE | Admit: 2013-08-20 | Discharge: 2013-08-20 | Disposition: A | Discharge status: Home / Self Care | Payer: Medicare Other | Source: Ambulatory Visit

## 2013-08-20 ENCOUNTER — Ambulatory Visit: Payer: Medicare Other

## 2013-08-20 DIAGNOSIS — Z1231 Encounter for screening mammogram for malignant neoplasm of breast: Secondary | ICD-10-CM

## 2013-09-15 ENCOUNTER — Encounter (INDEPENDENT_AMBULATORY_CARE_PROVIDER_SITE_OTHER): Payer: Self-pay

## 2013-09-21 ENCOUNTER — Encounter (INDEPENDENT_AMBULATORY_CARE_PROVIDER_SITE_OTHER): Payer: Self-pay

## 2013-09-25 ENCOUNTER — Encounter (INDEPENDENT_AMBULATORY_CARE_PROVIDER_SITE_OTHER): Payer: Self-pay | Admitting: General Surgery

## 2013-09-25 ENCOUNTER — Ambulatory Visit (INDEPENDENT_AMBULATORY_CARE_PROVIDER_SITE_OTHER): Payer: Medicare Other | Admitting: General Surgery

## 2013-09-25 VITALS — BP 122/70 | HR 78 | Temp 98.1°F | Ht 64.0 in | Wt 138.5 lb

## 2013-09-25 DIAGNOSIS — Z86010 Personal history of colonic polyps: Secondary | ICD-10-CM

## 2013-09-25 NOTE — Patient Instructions (Signed)
Follow up with me as needed.

## 2013-09-25 NOTE — Progress Notes (Signed)
Diane Wise is a 72 y.o. female who is here for a follow up visit regarding her colon polyp.  She underwent an appendectomy in June for a appendiceal orifice polyp.  The pathology suggested the margin was not complete. She has underwent a colonoscopy with Dr. Matthias Hughs, where he remove some polypoid tissue from the cecum. The was no other signs of remaining tumor. She reports continued improvement. She denies any changes in bowel habits or rectal bleeding. She is having a small amount of incisional pain at her umbilicus on occasion.  Objective: Filed Vitals:   09/25/13 1543  BP: 122/70  Pulse: 78  Temp: 98.1 F (36.7 C)    General appearance: alert and cooperative GI: normal findings: soft, non-tender incision: Healing well   Assessment and Plan: Diane Wise is recovering well from her surgery. I will see her back on an as-needed basis.    Vanita Panda, MD [facility], [place] [phone]

## 2014-07-06 ENCOUNTER — Other Ambulatory Visit: Payer: Self-pay

## 2014-07-06 DIAGNOSIS — Z1231 Encounter for screening mammogram for malignant neoplasm of breast: Secondary | ICD-10-CM

## 2014-08-23 ENCOUNTER — Ambulatory Visit
Admission: RE | Admit: 2014-08-23 | Discharge: 2014-08-23 | Disposition: A | Discharge status: Home / Self Care | Payer: Medicare Other | Source: Ambulatory Visit

## 2014-08-23 DIAGNOSIS — Z1231 Encounter for screening mammogram for malignant neoplasm of breast: Secondary | ICD-10-CM

## 2014-10-27 ENCOUNTER — Ambulatory Visit
Admission: RE | Admit: 2014-10-27 | Discharge: 2014-10-27 | Disposition: A | Discharge status: Home / Self Care | Payer: Medicare Other | Source: Ambulatory Visit | Attending: Physician Assistant | Admitting: Physician Assistant

## 2014-10-27 ENCOUNTER — Other Ambulatory Visit: Payer: Self-pay | Admitting: Physician Assistant

## 2014-10-27 DIAGNOSIS — R109 Unspecified abdominal pain: Secondary | ICD-10-CM

## 2015-04-27 ENCOUNTER — Other Ambulatory Visit: Payer: Self-pay | Admitting: Gastroenterology

## 2015-04-27 DIAGNOSIS — R1084 Generalized abdominal pain: Secondary | ICD-10-CM

## 2015-05-05 ENCOUNTER — Ambulatory Visit
Admission: RE | Admit: 2015-05-05 | Discharge: 2015-05-05 | Disposition: A | Discharge status: Home / Self Care | Payer: Medicare Other | Source: Ambulatory Visit | Attending: Gastroenterology | Admitting: Gastroenterology

## 2015-05-05 DIAGNOSIS — R1084 Generalized abdominal pain: Secondary | ICD-10-CM

## 2015-05-05 MED ORDER — IOPAMIDOL (ISOVUE-300) INJECTION 61%
100.0000 mL | Freq: Once | INTRAVENOUS | Status: AC | PRN
  Administered 2015-05-05: 100 mL via INTRAVENOUS

## 2015-08-05 ENCOUNTER — Other Ambulatory Visit: Payer: Self-pay

## 2015-08-05 DIAGNOSIS — Z1231 Encounter for screening mammogram for malignant neoplasm of breast: Secondary | ICD-10-CM

## 2015-09-07 ENCOUNTER — Ambulatory Visit
Admission: RE | Admit: 2015-09-07 | Discharge: 2015-09-07 | Disposition: A | Discharge status: Home / Self Care | Payer: Medicare Other | Source: Ambulatory Visit

## 2015-09-07 DIAGNOSIS — Z1231 Encounter for screening mammogram for malignant neoplasm of breast: Secondary | ICD-10-CM

## 2016-08-31 ENCOUNTER — Other Ambulatory Visit: Payer: Self-pay | Admitting: Family Medicine

## 2016-08-31 DIAGNOSIS — Z1231 Encounter for screening mammogram for malignant neoplasm of breast: Secondary | ICD-10-CM

## 2016-09-20 ENCOUNTER — Ambulatory Visit
Admission: RE | Admit: 2016-09-20 | Discharge: 2016-09-20 | Disposition: A | Discharge status: Home / Self Care | Payer: Medicare Other | Source: Ambulatory Visit | Attending: Family Medicine | Admitting: Family Medicine

## 2016-09-20 ENCOUNTER — Ambulatory Visit: Payer: Medicare Other

## 2016-09-20 DIAGNOSIS — Z1231 Encounter for screening mammogram for malignant neoplasm of breast: Secondary | ICD-10-CM

## 2018-02-18 ENCOUNTER — Other Ambulatory Visit: Payer: Self-pay | Admitting: Family Medicine

## 2018-02-18 DIAGNOSIS — Z1231 Encounter for screening mammogram for malignant neoplasm of breast: Secondary | ICD-10-CM

## 2018-02-19 ENCOUNTER — Ambulatory Visit: Payer: Medicare Other

## 2018-07-22 ENCOUNTER — Ambulatory Visit: Payer: Self-pay | Admitting: Neurology

## 2018-07-31 ENCOUNTER — Ambulatory Visit: Payer: Medicare Other | Admitting: Neurology

## 2018-07-31 ENCOUNTER — Encounter: Payer: Self-pay | Admitting: Neurology

## 2018-07-31 DIAGNOSIS — R413 Other amnesia: Secondary | ICD-10-CM

## 2018-07-31 HISTORY — DX: Other amnesia: R41.3

## 2018-07-31 MED ORDER — DONEPEZIL HCL 5 MG PO TABS: 5.0000 mg | ORAL_TABLET | Freq: Every day | ORAL | 1 refills | Status: AC

## 2018-07-31 NOTE — Progress Notes (Signed)
Reason for visit: Memory disturbance  Referring physician: Dr. Rocky Link Diane Wise is a 77 y.o. female  History of present illness:  Diane Wise is a 77 year old right-handed white female with a history of a progressive memory disturbance.  The patient herself does not believe that there is anything wrong with her memory.  She has not driven a car in about 1 year, it is not clear that there were any issues with her driving.  The family with her today believes that there has been some gradual change in memory over the last 2 years.  The patient has been more forgetful, she will repeat herself frequently, she now needs assistance with keeping up with her finances, this has occurred within the last month.  She was not paying her bills on time.  The patient may call her hairdresser multiple times a day to confirm an appointment.  The patient does keep up with her own medications, she does need assistance with appointments.  She indicates that her mother also had memory problems when she was older.  The patient sleeps fairly well at night, she has a good energy level during the day.  She denies any numbness or weakness of the extremities or any balance changes.  She denies headaches or dizziness.  She apparently had blood work done through her primary care doctor including a B12 level, the results are not available to me.  CT of the brain apparently was done at [facility], I do not have these results.  The patient is sent to this office for further evaluation.  Past Medical History:  Diagnosis Date  . Complication of anesthesia    SLOW TO WAKE UP  . GERD (gastroesophageal reflux disease)   . Osteoporosis     Past Surgical History:  Procedure Laterality Date  . ABDOMINAL HYSTERECTOMY  1984  . APPENDECTOMY  05/20/13  . LAPAROSCOPIC APPENDECTOMY N/A 05/20/2013   Procedure: APPENDECTOMY LAPAROSCOPIC;  Surgeon: Leighton Ruff, MD;  Location: [facility]S;  Service: General;  Laterality: N/A;  .  TONSILLECTOMY      Family History  Problem Relation Age of Onset  . Cancer Father   . Diabetes Sister   . Diabetes Brother     Social history:  reports that she has never smoked. She has never used smokeless tobacco. She reports that she does not drink alcohol or use drugs.  Medications:  Prior to Admission medications   Medication Sig Start Date End Date Taking? Authorizing Provider  acetaminophen (TYLENOL) 325 MG tablet Take 2 tablets (650 mg total) by mouth every 4 (four) hours as needed. 3/41/96  Yes Leighton Ruff, MD  aspirin EC 81 MG tablet Take 81 mg by mouth every 6 (six) hours as needed for pain.   Yes [provider]  cyanocobalamin 100 MCG tablet Take 100 mcg by mouth daily.   Yes [provider]  Flaxseed, Linseed, (FLAX SEED OIL PO) Take 1 tablet by mouth daily.    Yes [provider]  Multiple Vitamin (MULTIVITAMIN) capsule Take 1 capsule by mouth daily.     Yes [provider]  Omeprazole-Sodium Bicarbonate (ZEGERID) 20-1100 MG CAPS Take 1 capsule by mouth daily before breakfast.   Yes [provider]  simvastatin (ZOCOR) 20 MG tablet Take 20 mg by mouth at bedtime.     Yes [provider]  sodium chloride (OCEAN) 0.65 % nasal spray Place 1 spray into the nose daily as needed for congestion.   Yes  [provider]      Allergies  Allergen Reactions  . Codeine Nausea And Vomiting  . Sulfa Antibiotics Nausea And Vomiting    ROS:  Out of a complete 14 system review of symptoms, the patient complains only of the following symptoms, and all other reviewed systems are negative.  Memory loss, confusion Hearing loss  Blood pressure 118/70, pulse 74, height 5\' 4"  (1.626 m), weight 121 lb 8 oz (55.1 kg), SpO2 97 %.  Physical Exam  General: The patient is alert and cooperative at the time of the examination.  Eyes: Pupils are equal, round, and reactive to light. Discs are flat bilaterally.  Neck: The  neck is supple, no carotid bruits are noted.  Respiratory: The respiratory examination is clear.  Cardiovascular: The cardiovascular examination reveals a regular rate and rhythm, no obvious murmurs or rubs are noted.  Skin: Extremities are without significant edema.  Neurologic Exam  Mental status: The patient is alert and oriented x 2 at the time of the examination (not oriented to date). The Mini-Mental status examination done today shows a total score of 22/30..  Cranial nerves: Facial symmetry is present. There is good sensation of the face to pinprick and soft touch bilaterally. The strength of the facial muscles and the muscles to head turning and shoulder shrug are normal bilaterally. Speech is well enunciated, no aphasia or dysarthria is noted. Extraocular movements are full. Visual fields are full. The tongue is midline, and the patient has symmetric elevation of the soft palate. No obvious hearing deficits are noted.  Motor: The motor testing reveals 5 over 5 strength of all 4 extremities. Good symmetric motor tone is noted throughout.  Sensory: Sensory testing is intact to pinprick, soft touch, vibration sensation, and position sense on all 4 extremities, with exception some decrease in position sense in the left foot. No evidence of extinction is noted.  Coordination: Cerebellar testing reveals good finger-nose-finger and heel-to-shin bilaterally.  Gait and station: Gait is normal. Tandem gait is unsteady. Romberg is negative. No drift is seen.  Reflexes: Deep tendon reflexes are symmetric and normal bilaterally. Toes are downgoing bilaterally.   Assessment/Plan:  1.  Memory disturbance  The patient appears to have had some memory issues over the last 2 years, and it is gradually getting worse.  We will try to get the written report of the CT of the brain done recently.  The patient will be started on low-dose Aricept taking 5 mg at night.  She will follow-up in 6 months.   They will call for any dose adjustments of the medication.  Addendum: CT scan of the head was done at [facility], this was done on 26 June 2018.  This shows no acute intracranial abnormality, there is mild atrophy.  No significant cerebrovascular disease is seen.  Jill Alexanders MD 07/31/2018 10:33 AM  [facility] [address] [place], [place] [address]  Phone [phone] Fax [phone]

## 2018-07-31 NOTE — Patient Instructions (Signed)
We will start Aricept for the memory.   Begin Aricept (donepezil) at 5 mg at night for one month. If this medication is well-tolerated, please call our office and we will call in a prescription for the 10 mg tablets. Look out for side effects that may include nausea, diarrhea, weight loss, or stomach cramps. This medication will also cause a runny nose, therefore there is no need for allergy medications for this purpose.  

## 2019-02-16 ENCOUNTER — Ambulatory Visit: Payer: Medicare Other | Admitting: Neurology

## 2019-06-23 DIAGNOSIS — D2372 Other benign neoplasm of skin of left lower limb, including hip: Secondary | ICD-10-CM | POA: Diagnosis not present

## 2019-06-23 DIAGNOSIS — D2371 Other benign neoplasm of skin of right lower limb, including hip: Secondary | ICD-10-CM | POA: Diagnosis not present

## 2019-09-03 ENCOUNTER — Ambulatory Visit: Payer: Medicare Other | Admitting: Neurology

## 2019-10-07 ENCOUNTER — Ambulatory Visit: Payer: Self-pay | Admitting: Neurology

## 2019-10-07 ENCOUNTER — Encounter

## 2019-10-07 DIAGNOSIS — H52203 Unspecified astigmatism, bilateral: Secondary | ICD-10-CM | POA: Diagnosis not present

## 2019-10-07 DIAGNOSIS — H0100B Unspecified blepharitis left eye, upper and lower eyelids: Secondary | ICD-10-CM | POA: Diagnosis not present

## 2019-10-07 DIAGNOSIS — H11002 Unspecified pterygium of left eye: Secondary | ICD-10-CM | POA: Diagnosis not present

## 2019-10-07 DIAGNOSIS — H0100A Unspecified blepharitis right eye, upper and lower eyelids: Secondary | ICD-10-CM | POA: Diagnosis not present

## 2019-12-07 DIAGNOSIS — R410 Disorientation, unspecified: Secondary | ICD-10-CM | POA: Diagnosis not present

## 2019-12-07 DIAGNOSIS — R110 Nausea: Secondary | ICD-10-CM | POA: Diagnosis not present

## 2019-12-07 DIAGNOSIS — R0600 Dyspnea, unspecified: Secondary | ICD-10-CM | POA: Diagnosis not present

## 2019-12-08 ENCOUNTER — Ambulatory Visit: Payer: Medicare Other | Attending: Internal Medicine

## 2019-12-08 ENCOUNTER — Other Ambulatory Visit: Payer: Self-pay

## 2019-12-08 DIAGNOSIS — Z20822 Contact with and (suspected) exposure to covid-19: Secondary | ICD-10-CM

## 2019-12-09 LAB — NOVEL CORONAVIRUS, NAA: SARS-CoV-2, NAA: DETECTED — AB

## 2019-12-18 DIAGNOSIS — Z20822 Contact with and (suspected) exposure to covid-19: Secondary | ICD-10-CM | POA: Diagnosis not present

## 2019-12-31 ENCOUNTER — Ambulatory Visit: Payer: Medicare Other | Admitting: Neurology

## 2020-01-04 ENCOUNTER — Telehealth: Payer: Self-pay | Admitting: Neurology

## 2020-01-04 NOTE — Telephone Encounter (Signed)
Katie Mcshan(great grand niece not on [place]) has called to report that pt's appointment set for 02-02 needed to be r/s to 02-03.  This is FYI, no call back requested.

## 2020-01-05 ENCOUNTER — Ambulatory Visit: Payer: Medicare Other | Admitting: Neurology

## 2020-01-06 ENCOUNTER — Ambulatory Visit: Payer: Medicare Other | Admitting: Neurology

## 2020-01-12 ENCOUNTER — Ambulatory Visit: Payer: Medicare Other | Admitting: Neurology

## 2020-01-12 NOTE — Progress Notes (Deleted)
PATIENT: Diane Wise DOB: 12-06-1940  REASON FOR VISIT: follow up HISTORY FROM: patient  HISTORY OF PRESENT ILLNESS: Today 01/12/20  [name] is an 79 year old female with history of a progressive memory disturbance.  She has not been seen at this office since August 2019.  CT scan in July 2019 showed mild atrophy.  HISTORY 07/31/2018 Dr. Jannifer Franklin: [name] is a 79 year old right-handed white female with a history of a progressive memory disturbance.  The patient herself does not believe that there is anything wrong with her memory.  She has not driven a car in about 1 year, it is not clear that there were any issues with her driving.  The family with her today believes that there has been some gradual change in memory over the last 2 years.  The patient has been more forgetful, she will repeat herself frequently, she now needs assistance with keeping up with her finances, this has occurred within the last month.  She was not paying her bills on time.  The patient may call her hairdresser multiple times a day to confirm an appointment.  The patient does keep up with her own medications, she does need assistance with appointments.  She indicates that her mother also had memory problems when she was older.  The patient sleeps fairly well at night, she has a good energy level during the day.  She denies any numbness or weakness of the extremities or any balance changes.  She denies headaches or dizziness.  She apparently had blood work done through her primary care doctor including a B12 level, the results are not available to me.  CT of the brain apparently was done at [facility], I do not have these results.  The patient is sent to this office for further evaluation.  REVIEW OF SYSTEMS: Out of a complete 14 system review of symptoms, the patient complains only of the following symptoms, and all other reviewed systems are negative.  ALLERGIES: Allergies  Allergen Reactions  . Codeine  Nausea And Vomiting  . Sulfa Antibiotics Nausea And Vomiting    HOME MEDICATIONS: Outpatient Medications Prior to Visit  Medication Sig Dispense Refill  . acetaminophen (TYLENOL) 325 MG tablet Take 2 tablets (650 mg total) by mouth every 4 (four) hours as needed.    Marland Kitchen aspirin EC 81 MG tablet Take 81 mg by mouth every 6 (six) hours as needed for pain.    . cyanocobalamin 100 MCG tablet Take 100 mcg by mouth daily.    Marland Kitchen donepezil (ARICEPT) 5 MG tablet Take 1 tablet (5 mg total) by mouth at bedtime. 30 tablet 1  . Flaxseed, Linseed, (FLAX SEED OIL PO) Take 1 tablet by mouth daily.     . Multiple Vitamin (MULTIVITAMIN) capsule Take 1 capsule by mouth daily.      Earney Navy Bicarbonate (ZEGERID) 20-1100 MG CAPS Take 1 capsule by mouth daily before breakfast.    . simvastatin (ZOCOR) 20 MG tablet Take 20 mg by mouth at bedtime.      . sodium chloride (OCEAN) 0.65 % nasal spray Place 1 spray into the nose daily as needed for congestion.     No facility-administered medications prior to visit.    PAST MEDICAL HISTORY: Past Medical History:  Diagnosis Date  . Complication of anesthesia    SLOW TO WAKE UP  . GERD (gastroesophageal reflux disease)   . Memory disorder 07/31/2018  . Osteoporosis     PAST SURGICAL HISTORY: Past Surgical History:  Procedure Laterality Date  . ABDOMINAL HYSTERECTOMY  1984  . APPENDECTOMY  05/20/13  . LAPAROSCOPIC APPENDECTOMY N/A 05/20/2013   Procedure: APPENDECTOMY LAPAROSCOPIC;  Surgeon: Leighton Ruff, MD;  Location: [facility];  Service: General;  Laterality: N/A;  . TONSILLECTOMY      FAMILY HISTORY: Family History  Problem Relation Age of Onset  . Cancer Father   . Diabetes Sister   . Diabetes Brother     SOCIAL HISTORY: Social History   Socioeconomic History  . Marital status: Married    Spouse name: Not on file  . Number of children: 0  . Years of education: 11  . Highest education level: Not on file  Occupational History  . Not on  file  Tobacco Use  . Smoking status: Never Smoker  . Smokeless tobacco: Never Used  Substance and Sexual Activity  . Alcohol use: No  . Drug use: No  . Sexual activity: Not on file  Other Topics Concern  . Not on file  Social History Narrative   Lives w/ husband   Caffeine use: some coffee and tea   Right handed    Social Determinants of Health   Financial Resource Strain:   . Difficulty of Paying Living Expenses: Not on file  Food Insecurity:   . Worried About Charity fundraiser in the Last Year: Not on file  . Ran Out of Food in the Last Year: Not on file  Transportation Needs:   . Lack of Transportation (Medical): Not on file  . Lack of Transportation (Non-Medical): Not on file  Physical Activity:   . Days of Exercise per Week: Not on file  . Minutes of Exercise per Session: Not on file  Stress:   . Feeling of Stress : Not on file  Social Connections:   . Frequency of Communication with Friends and Family: Not on file  . Frequency of Social Gatherings with Friends and Family: Not on file  . Attends Religious Services: Not on file  . Active Member of Clubs or Organizations: Not on file  . Attends Archivist Meetings: Not on file  . Marital Status: Not on file  Intimate Partner Violence:   . Fear of Current or Ex-Partner: Not on file  . Emotionally Abused: Not on file  . Physically Abused: Not on file  . Sexually Abused: Not on file      PHYSICAL EXAM  There were no vitals filed for this visit. There is no height or weight on file to calculate BMI.  Generalized: Well developed, in no acute distress   Neurological examination  Mentation: Alert oriented to time, place, history taking. Follows all commands speech and language fluent Cranial nerve II-XII: Pupils were equal round reactive to light. Extraocular movements were full, visual field were full on confrontational test. Facial sensation and strength were normal. Uvula tongue midline. Head turning  and shoulder shrug  were normal and symmetric. Motor: The motor testing reveals 5 over 5 strength of all 4 extremities. Good symmetric motor tone is noted throughout.  Sensory: Sensory testing is intact to soft touch on all 4 extremities. No evidence of extinction is noted.  Coordination: Cerebellar testing reveals good finger-nose-finger and heel-to-shin bilaterally.  Gait and station: Gait is normal. Tandem gait is normal. Romberg is negative. No drift is seen.  Reflexes: Deep tendon reflexes are symmetric and normal bilaterally.   DIAGNOSTIC DATA (LABS, IMAGING, TESTING) - I reviewed patient records, labs, notes, testing and imaging myself where available.  Lab Results  Component Value Date   WBC 18.1 (H) 05/21/2013   HGB 13.3 05/21/2013   HCT 40.7 05/21/2013   MCV 85.7 05/21/2013   PLT 214 05/21/2013      Component Value Date/Time   NA 137 05/21/2013 0435   K 4.9 05/21/2013 0435   CL 103 05/21/2013 0435   CO2 25 05/21/2013 0435   GLUCOSE 148 (H) 05/21/2013 0435   BUN 15 05/21/2013 0435   CREATININE 0.75 05/21/2013 0435   CALCIUM 9.1 05/21/2013 0435   GFRNONAA 82 (L) 05/21/2013 0435   GFRAA >90 05/21/2013 0435   No results found for: CHOL, HDL, LDLCALC, LDLDIRECT, TRIG, CHOLHDL No results found for: HGBA1C No results found for: VITAMINB12 No results found for: TSH    ASSESSMENT AND PLAN 79 y.o. year old female  has a past medical history of Complication of anesthesia, GERD (gastroesophageal reflux disease), Memory disorder (07/31/2018), and Osteoporosis. here with ***   I spent 15 minutes with the patient. 50% of this time was spent   Butler Denmark, [place], DNP 01/12/2020, 5:44 AM [facility] [address] [place], [place] [address] [phone]

## 2020-04-07 DIAGNOSIS — Z0000 Encounter for general adult medical examination without abnormal findings: Secondary | ICD-10-CM | POA: Diagnosis not present

## 2020-04-07 DIAGNOSIS — N1831 Chronic kidney disease, stage 3a: Secondary | ICD-10-CM | POA: Diagnosis not present

## 2020-04-07 DIAGNOSIS — E785 Hyperlipidemia, unspecified: Secondary | ICD-10-CM | POA: Diagnosis not present

## 2020-04-07 DIAGNOSIS — N179 Acute kidney failure, unspecified: Secondary | ICD-10-CM | POA: Diagnosis not present

## 2020-07-11 DIAGNOSIS — Z20822 Contact with and (suspected) exposure to covid-19: Secondary | ICD-10-CM | POA: Diagnosis not present

## 2020-07-22 DIAGNOSIS — E119 Type 2 diabetes mellitus without complications: Secondary | ICD-10-CM | POA: Diagnosis not present

## 2020-07-22 DIAGNOSIS — E7849 Other hyperlipidemia: Secondary | ICD-10-CM | POA: Diagnosis not present

## 2020-07-22 DIAGNOSIS — N189 Chronic kidney disease, unspecified: Secondary | ICD-10-CM | POA: Diagnosis not present

## 2020-07-22 DIAGNOSIS — R600 Localized edema: Secondary | ICD-10-CM | POA: Diagnosis not present

## 2020-07-22 DIAGNOSIS — Z79899 Other long term (current) drug therapy: Secondary | ICD-10-CM | POA: Diagnosis not present

## 2020-07-22 DIAGNOSIS — D518 Other vitamin B12 deficiency anemias: Secondary | ICD-10-CM | POA: Diagnosis not present

## 2020-07-22 DIAGNOSIS — L989 Disorder of the skin and subcutaneous tissue, unspecified: Secondary | ICD-10-CM | POA: Diagnosis not present

## 2020-07-22 DIAGNOSIS — E038 Other specified hypothyroidism: Secondary | ICD-10-CM | POA: Diagnosis not present

## 2020-07-25 DIAGNOSIS — R609 Edema, unspecified: Secondary | ICD-10-CM | POA: Diagnosis not present

## 2020-08-26 DIAGNOSIS — R451 Restlessness and agitation: Secondary | ICD-10-CM | POA: Diagnosis not present

## 2020-08-31 DIAGNOSIS — L989 Disorder of the skin and subcutaneous tissue, unspecified: Secondary | ICD-10-CM | POA: Diagnosis not present

## 2020-08-31 DIAGNOSIS — N189 Chronic kidney disease, unspecified: Secondary | ICD-10-CM | POA: Diagnosis not present

## 2020-08-31 DIAGNOSIS — R600 Localized edema: Secondary | ICD-10-CM | POA: Diagnosis not present

## 2020-09-23 DIAGNOSIS — M2041 Other hammer toe(s) (acquired), right foot: Secondary | ICD-10-CM | POA: Diagnosis not present

## 2020-09-23 DIAGNOSIS — L84 Corns and callosities: Secondary | ICD-10-CM | POA: Diagnosis not present

## 2020-09-23 DIAGNOSIS — N189 Chronic kidney disease, unspecified: Secondary | ICD-10-CM | POA: Diagnosis not present

## 2020-09-23 DIAGNOSIS — B351 Tinea unguium: Secondary | ICD-10-CM | POA: Diagnosis not present

## 2020-09-27 DIAGNOSIS — K219 Gastro-esophageal reflux disease without esophagitis: Secondary | ICD-10-CM | POA: Diagnosis not present

## 2020-09-27 DIAGNOSIS — N189 Chronic kidney disease, unspecified: Secondary | ICD-10-CM | POA: Diagnosis not present

## 2020-09-27 DIAGNOSIS — E785 Hyperlipidemia, unspecified: Secondary | ICD-10-CM | POA: Diagnosis not present

## 2020-09-27 DIAGNOSIS — R600 Localized edema: Secondary | ICD-10-CM | POA: Diagnosis not present

## 2020-10-25 DIAGNOSIS — E785 Hyperlipidemia, unspecified: Secondary | ICD-10-CM | POA: Diagnosis not present

## 2020-10-25 DIAGNOSIS — M6281 Muscle weakness (generalized): Secondary | ICD-10-CM | POA: Diagnosis not present

## 2020-10-25 DIAGNOSIS — N189 Chronic kidney disease, unspecified: Secondary | ICD-10-CM | POA: Diagnosis not present

## 2020-10-25 DIAGNOSIS — K219 Gastro-esophageal reflux disease without esophagitis: Secondary | ICD-10-CM | POA: Diagnosis not present

## 2020-11-10 DIAGNOSIS — R2681 Unsteadiness on feet: Secondary | ICD-10-CM | POA: Diagnosis not present

## 2020-11-16 DIAGNOSIS — E559 Vitamin D deficiency, unspecified: Secondary | ICD-10-CM | POA: Diagnosis not present

## 2020-11-16 DIAGNOSIS — E7849 Other hyperlipidemia: Secondary | ICD-10-CM | POA: Diagnosis not present

## 2020-11-16 DIAGNOSIS — E119 Type 2 diabetes mellitus without complications: Secondary | ICD-10-CM | POA: Diagnosis not present

## 2020-11-16 DIAGNOSIS — Z79899 Other long term (current) drug therapy: Secondary | ICD-10-CM | POA: Diagnosis not present

## 2020-11-16 DIAGNOSIS — E038 Other specified hypothyroidism: Secondary | ICD-10-CM | POA: Diagnosis not present

## 2020-11-16 DIAGNOSIS — D518 Other vitamin B12 deficiency anemias: Secondary | ICD-10-CM | POA: Diagnosis not present

## 2020-11-22 DIAGNOSIS — E785 Hyperlipidemia, unspecified: Secondary | ICD-10-CM | POA: Diagnosis not present

## 2020-11-22 DIAGNOSIS — K219 Gastro-esophageal reflux disease without esophagitis: Secondary | ICD-10-CM | POA: Diagnosis not present

## 2020-11-22 DIAGNOSIS — N189 Chronic kidney disease, unspecified: Secondary | ICD-10-CM | POA: Diagnosis not present

## 2020-11-23 DIAGNOSIS — M545 Low back pain, unspecified: Secondary | ICD-10-CM | POA: Diagnosis not present

## 2020-11-23 DIAGNOSIS — M546 Pain in thoracic spine: Secondary | ICD-10-CM | POA: Diagnosis not present

## 2020-11-23 DIAGNOSIS — M25552 Pain in left hip: Secondary | ICD-10-CM | POA: Diagnosis not present

## 2020-11-23 DIAGNOSIS — M25551 Pain in right hip: Secondary | ICD-10-CM | POA: Diagnosis not present

## 2021-03-21 ENCOUNTER — Other Ambulatory Visit: Payer: Self-pay

## 2021-03-21 ENCOUNTER — Encounter (HOSPITAL_COMMUNITY): Payer: Self-pay | Admitting: Emergency Medicine

## 2021-03-21 ENCOUNTER — Emergency Department (HOSPITAL_COMMUNITY): Payer: Medicare Other

## 2021-03-21 ENCOUNTER — Inpatient Hospital Stay (HOSPITAL_COMMUNITY)
Admission: EM | Admit: 2021-03-21 | Discharge: 2021-03-27 | DRG: 522 | Disposition: A | Discharge status: Skilled Nursing Facility | Payer: Medicare Other | Attending: Family Medicine | Admitting: Family Medicine

## 2021-03-21 DIAGNOSIS — Z7189 Other specified counseling: Secondary | ICD-10-CM | POA: Diagnosis not present

## 2021-03-21 DIAGNOSIS — W19XXXA Unspecified fall, initial encounter: Secondary | ICD-10-CM

## 2021-03-21 DIAGNOSIS — Z96642 Presence of left artificial hip joint: Secondary | ICD-10-CM

## 2021-03-21 DIAGNOSIS — K219 Gastro-esophageal reflux disease without esophagitis: Secondary | ICD-10-CM | POA: Diagnosis present

## 2021-03-21 DIAGNOSIS — Z634 Disappearance and death of family member: Secondary | ICD-10-CM | POA: Diagnosis not present

## 2021-03-21 DIAGNOSIS — F0390 Unspecified dementia without behavioral disturbance: Secondary | ICD-10-CM | POA: Diagnosis present

## 2021-03-21 DIAGNOSIS — R413 Other amnesia: Secondary | ICD-10-CM | POA: Diagnosis present

## 2021-03-21 DIAGNOSIS — W1830XA Fall on same level, unspecified, initial encounter: Secondary | ICD-10-CM | POA: Diagnosis present

## 2021-03-21 DIAGNOSIS — Z9049 Acquired absence of other specified parts of digestive tract: Secondary | ICD-10-CM

## 2021-03-21 DIAGNOSIS — Z8781 Personal history of (healed) traumatic fracture: Secondary | ICD-10-CM

## 2021-03-21 DIAGNOSIS — F419 Anxiety disorder, unspecified: Secondary | ICD-10-CM | POA: Diagnosis present

## 2021-03-21 DIAGNOSIS — D72829 Elevated white blood cell count, unspecified: Secondary | ICD-10-CM | POA: Diagnosis present

## 2021-03-21 DIAGNOSIS — S72012A Unspecified intracapsular fracture of left femur, initial encounter for closed fracture: Principal | ICD-10-CM | POA: Diagnosis present

## 2021-03-21 DIAGNOSIS — Z882 Allergy status to sulfonamides status: Secondary | ICD-10-CM

## 2021-03-21 DIAGNOSIS — Z86010 Personal history of colonic polyps: Secondary | ICD-10-CM

## 2021-03-21 DIAGNOSIS — R531 Weakness: Secondary | ICD-10-CM | POA: Diagnosis present

## 2021-03-21 DIAGNOSIS — H9190 Unspecified hearing loss, unspecified ear: Secondary | ICD-10-CM | POA: Diagnosis present

## 2021-03-21 DIAGNOSIS — Z90710 Acquired absence of both cervix and uterus: Secondary | ICD-10-CM

## 2021-03-21 DIAGNOSIS — Z7982 Long term (current) use of aspirin: Secondary | ICD-10-CM

## 2021-03-21 DIAGNOSIS — E785 Hyperlipidemia, unspecified: Secondary | ICD-10-CM | POA: Diagnosis present

## 2021-03-21 DIAGNOSIS — M810 Age-related osteoporosis without current pathological fracture: Secondary | ICD-10-CM | POA: Diagnosis present

## 2021-03-21 DIAGNOSIS — Z20822 Contact with and (suspected) exposure to covid-19: Secondary | ICD-10-CM | POA: Diagnosis present

## 2021-03-21 DIAGNOSIS — M40204 Unspecified kyphosis, thoracic region: Secondary | ICD-10-CM | POA: Diagnosis present

## 2021-03-21 DIAGNOSIS — S72002A Fracture of unspecified part of neck of left femur, initial encounter for closed fracture: Secondary | ICD-10-CM | POA: Diagnosis not present

## 2021-03-21 DIAGNOSIS — F0280 Dementia in other diseases classified elsewhere without behavioral disturbance: Secondary | ICD-10-CM

## 2021-03-21 DIAGNOSIS — F32A Depression, unspecified: Secondary | ICD-10-CM | POA: Diagnosis not present

## 2021-03-21 DIAGNOSIS — Z79899 Other long term (current) drug therapy: Secondary | ICD-10-CM

## 2021-03-21 DIAGNOSIS — Z885 Allergy status to narcotic agent status: Secondary | ICD-10-CM

## 2021-03-21 DIAGNOSIS — Z515 Encounter for palliative care: Secondary | ICD-10-CM | POA: Diagnosis not present

## 2021-03-21 DIAGNOSIS — G309 Alzheimer's disease, unspecified: Secondary | ICD-10-CM | POA: Diagnosis not present

## 2021-03-21 LAB — CBC
HCT: 47.1 % — ABNORMAL HIGH (ref 36.0–46.0)
Hemoglobin: 15.1 g/dL — ABNORMAL HIGH (ref 12.0–15.0)
MCH: 28.5 pg (ref 26.0–34.0)
MCHC: 32.1 g/dL (ref 30.0–36.0)
MCV: 88.9 fL (ref 80.0–100.0)
Platelets: 197 10*3/uL (ref 150–400)
RBC: 5.3 MIL/uL — ABNORMAL HIGH (ref 3.87–5.11)
RDW: 14.3 % (ref 11.5–15.5)
WBC: 15.5 10*3/uL — ABNORMAL HIGH (ref 4.0–10.5)
nRBC: 0 % (ref 0.0–0.2)

## 2021-03-21 LAB — RESP PANEL BY RT-PCR (FLU A&B, COVID) ARPGX2
Influenza A by PCR: NEGATIVE
Influenza B by PCR: NEGATIVE
SARS Coronavirus 2 by RT PCR: NEGATIVE

## 2021-03-21 LAB — COMPREHENSIVE METABOLIC PANEL
ALT: 22 U/L (ref 0–44)
AST: 27 U/L (ref 15–41)
Albumin: 3.9 g/dL (ref 3.5–5.0)
Alkaline Phosphatase: 64 U/L (ref 38–126)
Anion gap: 10 (ref 5–15)
BUN: 14 mg/dL (ref 8–23)
CO2: 22 mmol/L (ref 22–32)
Calcium: 8.7 mg/dL — ABNORMAL LOW (ref 8.9–10.3)
Chloride: 103 mmol/L (ref 98–111)
Creatinine, Ser: 0.74 mg/dL (ref 0.44–1.00)
GFR, Estimated: >60 mL/min (ref >60)
Glucose, Bld: 128 mg/dL — ABNORMAL HIGH (ref 70–99)
Potassium: 3.9 mmol/L (ref 3.5–5.1)
Sodium: 135 mmol/L (ref 135–145)
Total Bilirubin: 0.6 mg/dL (ref 0.3–1.2)
Total Protein: 7 g/dL (ref 6.5–8.1)

## 2021-03-21 LAB — TYPE AND SCREEN
ABO/RH(D): O NEG
Antibody Screen: NEGATIVE

## 2021-03-21 LAB — PROTIME-INR
INR: 1.2 (ref 0.8–1.2)
Prothrombin Time: 14.9 seconds (ref 11.4–15.2)

## 2021-03-21 MED ORDER — MORPHINE SULFATE (PF) 2 MG/ML IV SOLN
2.0000 mg | INTRAVENOUS | Status: DC | PRN
  Administered 2021-03-21 – 2021-03-23 (×3): 2 mg via INTRAVENOUS
  Filled 2021-03-21 (×3): qty 1

## 2021-03-21 MED ORDER — SERTRALINE HCL 50 MG PO TABS
50.0000 mg | ORAL_TABLET | Freq: Every day | ORAL | Status: DC
  Administered 2021-03-23 – 2021-03-25 (×3): 50 mg via ORAL
  Filled 2021-03-21 (×5): qty 1

## 2021-03-21 MED ORDER — QUETIAPINE FUMARATE 25 MG PO TABS
25.0000 mg | ORAL_TABLET | Freq: Every evening | ORAL | Status: DC | PRN
  Administered 2021-03-21: 25 mg via ORAL
  Filled 2021-03-21: qty 1

## 2021-03-21 MED ORDER — LORAZEPAM 0.5 MG PO TABS
0.5000 mg | ORAL_TABLET | Freq: Three times a day (TID) | ORAL | Status: DC | PRN
  Administered 2021-03-21 – 2021-03-26 (×4): 0.5 mg via ORAL
  Filled 2021-03-21 (×5): qty 1

## 2021-03-21 MED ORDER — LACTATED RINGERS IV SOLN: INTRAVENOUS | Status: DC

## 2021-03-21 MED ORDER — ACETAMINOPHEN 650 MG RE SUPP: 650.0000 mg | Freq: Four times a day (QID) | RECTAL | Status: DC | PRN

## 2021-03-21 MED ORDER — ONDANSETRON HCL 4 MG/2ML IJ SOLN: 4.0000 mg | Freq: Four times a day (QID) | INTRAMUSCULAR | Status: DC | PRN

## 2021-03-21 MED ORDER — ACETAMINOPHEN 325 MG PO TABS: 650.0000 mg | ORAL_TABLET | Freq: Four times a day (QID) | ORAL | Status: DC | PRN

## 2021-03-21 MED ORDER — POLYETHYLENE GLYCOL 3350 17 G PO PACK: 17.0000 g | PACK | Freq: Every day | ORAL | Status: DC | PRN

## 2021-03-21 MED ORDER — ONDANSETRON HCL 4 MG PO TABS: 4.0000 mg | ORAL_TABLET | Freq: Four times a day (QID) | ORAL | Status: DC | PRN

## 2021-03-21 NOTE — ED Notes (Signed)
Patient transported to X-ray 

## 2021-03-21 NOTE — ED Provider Notes (Signed)
[facility] EMERGENCY DEPARTMENT Provider Note   CSN: [id number] Arrival date & time: 03/21/21  1421     History Chief Complaint  Patient presents with  . Weakness    Diane Wise is a 80 y.o. female.  HPI   This patient is an 80 year old female, she has a history of dementia, she has a history of chronic kidney disease, hyperlipidemia and acid reflux, she comes to the hospital today after having a fall, this was witnessed by staff at [place] where she lives.  Evidently the patient was standing, she fell backwards onto her back and initially did not have any complaints but as the day went on she had more complaints of back pain.  She did not fall onto her buttock, she did not hit her head, she did not lose consciousness and has not had any changes in mental status since this occurred.  It is unclear exactly why the fall occurred.  Review of the medical record shows that the patient takes Ativan twice a day for persistent anxiety, she takes sertraline, she takes Seroquel at night.  The patient has dementia, she is not able to give me any further information she does states over and over "I just do not feel good" but cannot tell me what that means.  Past Medical History:  Diagnosis Date  . Complication of anesthesia    SLOW TO WAKE UP  . GERD (gastroesophageal reflux disease)   . Memory disorder 07/31/2018  . Osteoporosis     Patient Active Problem List   Diagnosis Date Noted  . Memory disorder 07/31/2018  . Personal history of colonic polyps 02/13/2013    Past Surgical History:  Procedure Laterality Date  . ABDOMINAL HYSTERECTOMY  1984  . APPENDECTOMY  05/20/13  . LAPAROSCOPIC APPENDECTOMY N/A 05/20/2013   Procedure: APPENDECTOMY LAPAROSCOPIC;  Surgeon: Leighton Ruff, MD;  Location: [facility];  Service: General;  Laterality: N/A;  . TONSILLECTOMY       OB History   No obstetric history on file.     Family History  Problem Relation Age of Onset  . Cancer Father    . Diabetes Sister   . Diabetes Brother     Social History   Tobacco Use  . Smoking status: Never Smoker  . Smokeless tobacco: Never Used  Vaping Use  . Vaping Use: Never used  Substance Use Topics  . Alcohol use: No  . Drug use: No    Home Medications Prior to Admission medications   Medication Sig Start Date End Date Taking? Authorizing Provider  acetaminophen (TYLENOL) 325 MG tablet Take 2 tablets (650 mg total) by mouth every 4 (four) hours as needed. 2/67/12  Yes Leighton Ruff, MD  LORazepam (ATIVAN) 0.5 MG tablet Take 0.5 mg by mouth daily as needed. 03/07/21  Yes [provider]  QUEtiapine (SEROQUEL) 25 MG tablet Take 25 mg by mouth at bedtime as needed (anxiety). 02/13/21  Yes [provider]  sertraline (ZOLOFT) 50 MG tablet Take 1 tablet by mouth daily. 03/07/21  Yes [provider]  aspirin EC 81 MG tablet Take 81 mg by mouth every 6 (six) hours as needed for pain.    [provider]  cyanocobalamin 100 MCG tablet Take 100 mcg by mouth daily.    [provider]  donepezil (ARICEPT) 5 MG tablet Take 1 tablet (5 mg total) by mouth at bedtime. Patient not taking: Reported on 03/21/2021 07/31/18   Kathrynn Ducking, MD  Flaxseed, Linseed, (FLAX  SEED OIL PO) Take 1 tablet by mouth daily.     [provider]  Multiple Vitamin (MULTIVITAMIN) capsule Take 1 capsule by mouth daily.      [provider]  Omeprazole-Sodium Bicarbonate (ZEGERID) 20-1100 MG CAPS Take 1 capsule by mouth daily before breakfast. Patient not taking: Reported on 03/21/2021    [provider]  simvastatin (ZOCOR) 20 MG tablet Take 20 mg by mouth at bedtime.   Patient not taking: Reported on 03/21/2021    [provider]  sodium chloride (OCEAN) 0.65 % nasal spray Place 1 spray into the nose daily as needed for congestion. Patient not taking: Reported on 03/21/2021    [provider]    Allergies    Codeine and Sulfa  antibiotics  Review of Systems   Review of Systems  Unable to perform ROS: Dementia    Physical Exam Updated Vital Signs BP 120/80   Pulse 77   Temp 97.8 F (36.6 C) (Oral)   Resp (!) 32   Ht 1.626 m (5\' 4" )   Wt 55.8 kg   SpO2 94%   BMI 21.11 kg/m   Physical Exam Vitals and nursing note reviewed.  Constitutional:      General: She is not in acute distress.    Appearance: She is well-developed.  HENT:     Head: Normocephalic and atraumatic.     Comments: Palpation over the scalp reveals no signs of hematoma or depression or other abnormalities    Mouth/Throat:     Pharynx: No oropharyngeal exudate.  Eyes:     General: No scleral icterus.       Right eye: No discharge.        Left eye: No discharge.     Conjunctiva/sclera: Conjunctivae normal.     Pupils: Pupils are equal, round, and reactive to light.  Neck:     Thyroid: No thyromegaly.     Vascular: No JVD.  Cardiovascular:     Rate and Rhythm: Normal rate and regular rhythm.     Heart sounds: Normal heart sounds. No murmur heard. No friction rub. No gallop.   Pulmonary:     Effort: Pulmonary effort is normal. No respiratory distress.     Breath sounds: Normal breath sounds. No wheezing or rales.  Abdominal:     General: Bowel sounds are normal. There is no distension.     Palpations: Abdomen is soft. There is no mass.     Tenderness: There is no abdominal tenderness.  Musculoskeletal:        General: No tenderness. Normal range of motion.     Cervical back: Normal range of motion and neck supple.     Comments: Kyphotic spine, no tenderness over the cervical thoracic or lumbar regions, no bilateral paraspinal tenderness, no tenderness over the ribs posteriorly laterally or anteriorly, able to move all 4 extremities with normal range of motion except for the left leg, she has some pain with lifting the leg, she points to her distal thigh medial, she is able to range of motion all of her joints passively without  crepitance or significant trauma, there is no leg length discrepancy  Lymphadenopathy:     Cervical: No cervical adenopathy.  Skin:    General: Skin is warm and dry.     Findings: No erythema or rash.  Neurological:     Mental Status: She is alert.     Coordination: Coordination normal.     Comments: Able to speak, able to  move all 4 extremities, she has a memory deficit  Psychiatric:        Behavior: Behavior normal.     ED Results / Procedures / Treatments   Labs (all labs ordered are listed, but only abnormal results are displayed) Labs Reviewed  COMPREHENSIVE METABOLIC PANEL - Abnormal; Notable for the following components:      Result Value   Glucose, Bld 128 (*)    Calcium 8.7 (*)    All other components within normal limits  CBC - Abnormal; Notable for the following components:   WBC 15.5 (*)    RBC 5.30 (*)    Hemoglobin 15.1 (*)    HCT 47.1 (*)    All other components within normal limits  RESP PANEL BY RT-PCR (FLU A&B, COVID) [id number]  PROTIME-INR  TYPE AND SCREEN    EKG EKG Interpretation  Date/Time:  Tuesday March 21 2021 16:30:27 EDT Ventricular Rate:  74 PR Interval:  176 QRS Duration: 102 QT Interval:  439 QTC Calculation: 488 R Axis:   80 Text Interpretation: Sinus rhythm Consider right atrial enlargement Borderline low voltage, extremity leads Borderline prolonged QT interval Baseline wander in lead(s) I II aVR V2 Confirmed by Noemi Chapel [phone]) on 03/21/2021 6:25:39 PM   Radiology DG Thoracic Spine W/Swimmers  Result Date: 03/21/2021 CLINICAL DATA:  [place] today. EXAM: THORACIC SPINE - 3 VIEWS COMPARISON:  None. FINDINGS: Age related degenerative changes and exaggerated thoracic kyphosis. No acute bony findings or destructive bony changes. No abnormal paraspinal soft tissue swelling. The visualized lungs are grossly clear. IMPRESSION: Age related degenerative changes but no acute bony findings. Electronically Signed   By: Marijo Sanes M.D.   On:  03/21/2021 16:40   DG Lumbar Spine Complete  Result Date: 03/21/2021 CLINICAL DATA:  [place]. EXAM: LUMBAR SPINE - COMPLETE 4+ VIEW COMPARISON:  None FINDINGS: There is a compression fracture of L1 of uncertain age. The other lumbar vertebral bodies are maintained. No definite pars defects. The visualized bony pelvis is intact. IMPRESSION: L1 compression fracture of uncertain age. Electronically Signed   By: Marijo Sanes M.D.   On: 03/21/2021 16:43   DG Pelvis 1-2 Views  Result Date: 03/21/2021 CLINICAL DATA:  [place].  Left hip pain. EXAM: PELVIS - 1-2 VIEW COMPARISON:  None. FINDINGS: Both hips are normally located. Impacted subcapital/high femoral neck fracture on the left. No right-sided hip fracture. The pubic symphysis and SI joints are intact. No pelvic fractures. IMPRESSION: Impacted subcapital/high femoral neck fracture on the left. Electronically Signed   By: Marijo Sanes M.D.   On: 03/21/2021 16:45   DG FEMUR MIN 2 VIEWS LEFT  Result Date: 03/21/2021 CLINICAL DATA:  [place].  Left hip pain. EXAM: LEFT FEMUR 2 VIEWS COMPARISON:  None. FINDINGS: There is a mildly displaced and impacted subcapital/high femoral neck fracture. No fracture of the femoral shaft. The knee joint is unremarkable. IMPRESSION: Mildly displaced and impacted subcapital/high femoral neck fracture. Electronically Signed   By: Marijo Sanes M.D.   On: 03/21/2021 16:44    Procedures Procedures   Medications Ordered in ED Medications - No data to display  ED Course  I have reviewed the triage vital signs and the nursing notes.  Pertinent labs & imaging results that were available during my care of the patient were reviewed by me and considered in my medical decision making (see chart for details).    MDM Rules/Calculators/A&P  Patient is well-appearing but has had a fall, she has no signs of trauma, normal vital signs, will get imaging of her left leg and her thoracic spine which is prominent  with her kyphosis.  Patient is agreeable to the plan, does not appear to be in distress, has normal heart rate with no signs of arrhythmia, afebrile, normal blood pressure.  WBC up at 15.5, has normal electrolytes and renal function.  Discussed the care with Dr. Aline Brochure who will consult for surgery, n.p.o. after midnight, will discuss with hospitalist for admission  Final Clinical Impression(s) / ED Diagnoses Final diagnoses:  Closed fracture of left hip, initial encounter [facility])    Rx / DC Orders ED Discharge Orders    None       Noemi Chapel, MD 03/21/21 1831

## 2021-03-21 NOTE — ED Notes (Signed)
Pt has memory disorder and unable to sign MSE waiver.

## 2021-03-21 NOTE — H&P (Signed)
History and Physical    Diane Wise JJH:[id number] DOB: 01-06-1941 DOA: 03/21/2021  PCP: [name], MD   Patient coming from: [facility]  I have personally briefly reviewed patient's old medical records in [place]  Chief Complaint: FALL  HPI: Diane Wise is a 80 y.o. female with medical history significant for dementia, Osteoporosis.  Patient is awake and alert, but due to baseline dementia, she is unable to answer questions appropriately, she keeps asking about her spouse.  History is obtained from chart review.   Patient was brought to the ED via EMS for reports of a fall at about 1145 today, patient was walking without a walker and so she fell.  Landing and fell backwards she was initially complaining of back pain,  ED Course: Stable.  Unremarkable BMP.  WBC 15.5.  Epic x-ray shows mildly displaced and impacted subcapital/high left femoral neck fracture. EDP provider talked to Dr. Aline Brochure who will consult for surgery.  Review of Systems: Unable to assess due to baseline dementia.  Past Medical History:  Diagnosis Date  . Complication of anesthesia    SLOW TO WAKE UP  . GERD (gastroesophageal reflux disease)   . Memory disorder 07/31/2018  . Osteoporosis     Past Surgical History:  Procedure Laterality Date  . ABDOMINAL HYSTERECTOMY  1984  . APPENDECTOMY  05/20/13  . LAPAROSCOPIC APPENDECTOMY N/A 05/20/2013   Procedure: APPENDECTOMY LAPAROSCOPIC;  Surgeon: Leighton Ruff, MD;  Location: [facility];  Service: General;  Laterality: N/A;  . TONSILLECTOMY       reports that she has never smoked. She has never used smokeless tobacco. She reports that she does not drink alcohol and does not use drugs.  Allergies  Allergen Reactions  . Codeine Nausea And Vomiting  . Sulfa Antibiotics Nausea And Vomiting    Family History  Problem Relation Age of Onset  . Cancer Father   . Diabetes Sister   . Diabetes Brother     Prior to Admission medications    Medication Sig Start Date End Date Taking? Authorizing Provider  acetaminophen (TYLENOL) 325 MG tablet Take 2 tablets (650 mg total) by mouth every 4 (four) hours as needed. 07/21/55  Yes Leighton Ruff, MD  LORazepam (ATIVAN) 0.5 MG tablet Take 0.5 mg by mouth daily as needed. 03/07/21  Yes [provider]  QUEtiapine (SEROQUEL) 25 MG tablet Take 25 mg by mouth at bedtime as needed (anxiety). 02/13/21  Yes [provider]  sertraline (ZOLOFT) 50 MG tablet Take 1 tablet by mouth daily. 03/07/21  Yes [provider]  aspirin EC 81 MG tablet Take 81 mg by mouth every 6 (six) hours as needed for pain.    [provider]  cyanocobalamin 100 MCG tablet Take 100 mcg by mouth daily.    [provider]  donepezil (ARICEPT) 5 MG tablet Take 1 tablet (5 mg total) by mouth at bedtime. Patient not taking: Reported on 03/21/2021 07/31/18   Kathrynn Ducking, MD  Flaxseed, Linseed, (FLAX SEED OIL PO) Take 1 tablet by mouth daily.     [provider]  Multiple Vitamin (MULTIVITAMIN) capsule Take 1 capsule by mouth daily.      [provider]  Omeprazole-Sodium Bicarbonate (ZEGERID) 20-1100 MG CAPS Take 1 capsule by mouth daily before breakfast. Patient not taking: Reported on 03/21/2021    [provider]  simvastatin (ZOCOR) 20 MG tablet Take 20 mg by mouth at bedtime.   Patient not taking: Reported  on 03/21/2021    [provider]  sodium chloride (OCEAN) 0.65 % nasal spray Place 1 spray into the nose daily as needed for congestion. Patient not taking: Reported on 03/21/2021    [provider]    Physical Exam: Exam limited by patient's mental status Vitals:   03/21/21 1800 03/21/21 1900 03/21/21 1930 03/21/21 2009  BP: 125/72 (!) 143/90 (!) 142/89 (!) 137/97  Pulse:      Resp: (!) 23 15 (!) 26 (!) 28  Temp:      TempSrc:      SpO2: 94% 96% 96%   Weight:      Height:        Constitutional: Confused, mildly  agitated-continuously asking to meet her husband Vitals:   03/21/21 1800 03/21/21 1900 03/21/21 1930 03/21/21 2009  BP: 125/72 (!) 143/90 (!) 142/89 (!) 137/97  Pulse:      Resp: (!) 23 15 (!) 26 (!) 28  Temp:      TempSrc:      SpO2: 94% 96% 96%   Weight:      Height:       Eyes: PERRL, lids and conjunctivae normal ENMT: Mucous membranes are dry. Neck: normal, supple, no masses, no thyromegaly Respiratory: clear to auscultation bilaterally, no wheezing, no crackles. Normal respiratory effort. No accessory muscle use.  Cardiovascular: Regular rate and rhythm, no murmurs / rubs / gallops. No extremity edema. 2+ pedal pulses.   Abdomen: no tenderness, no masses palpated. No hepatosplenomegaly. Bowel sounds positive.  Musculoskeletal: no clubbing / cyanosis. No joint deformity upper and lower extremities. Good ROM, no contractures. Normal muscle tone.  Skin: no rashes, lesions, ulcers. No induration Neurologic: No apparent cranial nerve abnormality, able to verbalize, moving upper extremities. Psychiatric: unable to assess due to baseline dementia  Labs on Admission: I have personally reviewed following labs and imaging studies  CBC: Recent Labs  Lab 03/21/21 1624  WBC 15.5*  HGB 15.1*  HCT 47.1*  MCV 88.9  PLT 332   Basic Metabolic Panel: Recent Labs  Lab 03/21/21 1624  NA 135  K 3.9  CL 103  CO2 22  GLUCOSE 128*  BUN 14  CREATININE 0.74  CALCIUM 8.7*   Liver Function Tests: Recent Labs  Lab 03/21/21 1624  AST 27  ALT 22  ALKPHOS 64  BILITOT 0.6  PROT 7.0  ALBUMIN 3.9   Coagulation Profile: Recent Labs  Lab 03/21/21 1854  INR 1.2    Radiological Exams on Admission: DG Thoracic Spine W/Swimmers  Result Date: 03/21/2021 CLINICAL DATA:  [place] today. EXAM: THORACIC SPINE - 3 VIEWS COMPARISON:  None. FINDINGS: Age related degenerative changes and exaggerated thoracic kyphosis. No acute bony findings or destructive bony changes. No abnormal paraspinal soft  tissue swelling. The visualized lungs are grossly clear. IMPRESSION: Age related degenerative changes but no acute bony findings. Electronically Signed   By: Marijo Sanes M.D.   On: 03/21/2021 16:40   DG Lumbar Spine Complete  Result Date: 03/21/2021 CLINICAL DATA:  [place]. EXAM: LUMBAR SPINE - COMPLETE 4+ VIEW COMPARISON:  None FINDINGS: There is a compression fracture of L1 of uncertain age. The other lumbar vertebral bodies are maintained. No definite pars defects. The visualized bony pelvis is intact. IMPRESSION: L1 compression fracture of uncertain age. Electronically Signed   By: Marijo Sanes M.D.   On: 03/21/2021 16:43   DG Pelvis 1-2 Views  Result Date: 03/21/2021 CLINICAL DATA:  [place].  Left hip pain. EXAM: PELVIS - 1-2 VIEW  COMPARISON:  None. FINDINGS: Both hips are normally located. Impacted subcapital/high femoral neck fracture on the left. No right-sided hip fracture. The pubic symphysis and SI joints are intact. No pelvic fractures. IMPRESSION: Impacted subcapital/high femoral neck fracture on the left. Electronically Signed   By: Marijo Sanes M.D.   On: 03/21/2021 16:45   DG Chest Port 1 View  Result Date: 03/21/2021 CLINICAL DATA:  Preop evaluation for left femoral repair EXAM: PORTABLE CHEST 1 VIEW COMPARISON:  02/16/2006 FINDINGS: Cardiac shadow is within normal limits. Small hiatal hernia is noted. Aortic calcifications are seen. Chronic left proximal humeral fracture is noted. Scarring is noted in the upper lobes bilaterally without focal infiltrate. No acute bony abnormality is seen. IMPRESSION: Chronic changes without acute abnormality. Electronically Signed   By: Inez Catalina M.D.   On: 03/21/2021 19:06   DG FEMUR MIN 2 VIEWS LEFT  Result Date: 03/21/2021 CLINICAL DATA:  [place].  Left hip pain. EXAM: LEFT FEMUR 2 VIEWS COMPARISON:  None. FINDINGS: There is a mildly displaced and impacted subcapital/high femoral neck fracture. No fracture of the femoral shaft. The knee joint is  unremarkable. IMPRESSION: Mildly displaced and impacted subcapital/high femoral neck fracture. Electronically Signed   By: Marijo Sanes M.D.   On: 03/21/2021 16:44    EKG: Independently reviewed.  Sinus rhythm, rate 74, QTc 488.  Nonspecific T wave abnormalities 1 aVL and V2, no prior EKG to compare.  Assessment/Plan Principal Problem:   Closed left hip fracture (HCC) Active Problems:   Memory disorder  Closed left hip fracture s/p mechanical fall- Pelvic xray shows- Impacted subcapital/high femoral neck fracture on the left.  - EDP to Dr. Aline Brochure, will see in a.m for surgery. -N.p.o. midnight -IV morphine 2 mg every 4 hourly as needed - N/s 100cc/hr x 15hrs  Leukocytosis-15. Possibly stress reaction.  Portable chest x-ray without abnormality -Trend. -Obtain UA  Dementia-confused at baseline. - resume seroquel and ativan PRN and zoloft,   DVT prophylaxis: Scds Code Status: Full code Family Communication: none at bedside Disposition Plan:  ~/> 2 days Consults called: Ortho Admission status: inpt, med surg I certify that at the point of admission it is my clinical judgment that the patient will require inpatient hospital care spanning beyond 2 midnights from the point of admission due to high intensity of service, high risk for further deterioration and high frequency of surveillance required. The following factors support the patient status of inpatient:    Bethena Roys MD [facility]  03/21/2021, 9:01 PM

## 2021-03-21 NOTE — ED Notes (Signed)
Nurse called Facility and [place] stated pt fell around 1145 hrs today straight on her back. Pt was walking without her walker and that's why she fell. Pt c/o back pain at the time and was unable to put weight on her left leg due to pain. Caryl Pina denies pt hitting her head during the fall.  Facility - [phone]

## 2021-03-21 NOTE — ED Triage Notes (Signed)
Pt from [facility], ems called out for weakness. Pt c/o abd pain and "not feeling good".

## 2021-03-22 ENCOUNTER — Inpatient Hospital Stay (HOSPITAL_COMMUNITY): Payer: Medicare Other

## 2021-03-22 DIAGNOSIS — E785 Hyperlipidemia, unspecified: Secondary | ICD-10-CM | POA: Diagnosis present

## 2021-03-22 DIAGNOSIS — F419 Anxiety disorder, unspecified: Secondary | ICD-10-CM | POA: Diagnosis present

## 2021-03-22 DIAGNOSIS — S72002A Fracture of unspecified part of neck of left femur, initial encounter for closed fracture: Secondary | ICD-10-CM | POA: Diagnosis not present

## 2021-03-22 DIAGNOSIS — F32A Depression, unspecified: Secondary | ICD-10-CM

## 2021-03-22 DIAGNOSIS — K219 Gastro-esophageal reflux disease without esophagitis: Secondary | ICD-10-CM | POA: Diagnosis present

## 2021-03-22 DIAGNOSIS — F0390 Unspecified dementia without behavioral disturbance: Secondary | ICD-10-CM | POA: Diagnosis present

## 2021-03-22 DIAGNOSIS — D72829 Elevated white blood cell count, unspecified: Secondary | ICD-10-CM | POA: Diagnosis present

## 2021-03-22 DIAGNOSIS — G309 Alzheimer's disease, unspecified: Secondary | ICD-10-CM

## 2021-03-22 DIAGNOSIS — F0280 Dementia in other diseases classified elsewhere without behavioral disturbance: Secondary | ICD-10-CM

## 2021-03-22 LAB — CBC
HCT: 45.5 % (ref 36.0–46.0)
Hemoglobin: 14.4 g/dL (ref 12.0–15.0)
MCH: 28 pg (ref 26.0–34.0)
MCHC: 31.6 g/dL (ref 30.0–36.0)
MCV: 88.3 fL (ref 80.0–100.0)
Platelets: 182 10*3/uL (ref 150–400)
RBC: 5.15 MIL/uL — ABNORMAL HIGH (ref 3.87–5.11)
RDW: 14.1 % (ref 11.5–15.5)
WBC: 12 10*3/uL — ABNORMAL HIGH (ref 4.0–10.5)
nRBC: 0 % (ref 0.0–0.2)

## 2021-03-22 LAB — URINALYSIS, ROUTINE W REFLEX MICROSCOPIC
Bilirubin Urine: NEGATIVE
Glucose, UA: NEGATIVE mg/dL
Hgb urine dipstick: NEGATIVE
Ketones, ur: 20 mg/dL — AB
Leukocytes,Ua: NEGATIVE
Nitrite: NEGATIVE
Protein, ur: NEGATIVE mg/dL
Specific Gravity, Urine: 1.018 (ref 1.005–1.030)
pH: 6 (ref 5.0–8.0)

## 2021-03-22 LAB — GLUCOSE, CAPILLARY
Glucose-Capillary: 115 mg/dL — ABNORMAL HIGH (ref 70–99)
Glucose-Capillary: 114 mg/dL — ABNORMAL HIGH (ref 70–99)

## 2021-03-22 LAB — SURGICAL PCR SCREEN
MRSA, PCR: NEGATIVE
Staphylococcus aureus: NEGATIVE

## 2021-03-22 LAB — ABO/RH: ABO/RH(D): O NEG

## 2021-03-22 MED ORDER — MUPIROCIN 2 % EX OINT
1.0000 "application " | TOPICAL_OINTMENT | Freq: Two times a day (BID) | CUTANEOUS | Status: DC
  Administered 2021-03-22: 1 via NASAL
  Filled 2021-03-22: qty 22

## 2021-03-22 MED ORDER — PANTOPRAZOLE SODIUM 40 MG PO TBEC
40.0000 mg | DELAYED_RELEASE_TABLET | Freq: Every day | ORAL | Status: DC
  Administered 2021-03-22 – 2021-03-25 (×4): 40 mg via ORAL
  Filled 2021-03-22 (×6): qty 1

## 2021-03-22 MED ORDER — CHLORHEXIDINE GLUCONATE 4 % EX LIQD
60.0000 mL | Freq: Once | CUTANEOUS | Status: AC
  Administered 2021-03-23: 4 via TOPICAL
  Filled 2021-03-22: qty 60

## 2021-03-22 MED ORDER — ASPIRIN EC 81 MG PO TBEC: 81.0000 mg | DELAYED_RELEASE_TABLET | Freq: Every day | ORAL | Status: DC

## 2021-03-22 MED ORDER — SODIUM CHLORIDE 0.9 % IV SOLN: INTRAVENOUS | Status: DC

## 2021-03-22 MED ORDER — DONEPEZIL HCL 5 MG PO TABS
5.0000 mg | ORAL_TABLET | Freq: Every day | ORAL | Status: DC
  Administered 2021-03-22 – 2021-03-26 (×5): 5 mg via ORAL
  Filled 2021-03-22 (×5): qty 1

## 2021-03-22 MED ORDER — SIMVASTATIN 20 MG PO TABS
20.0000 mg | ORAL_TABLET | Freq: Every day | ORAL | Status: DC
  Administered 2021-03-22 – 2021-03-26 (×5): 20 mg via ORAL
  Filled 2021-03-22 (×5): qty 1

## 2021-03-22 NOTE — Plan of Care (Signed)
  Problem: Education: Goal: Knowledge of General Education information will improve Description: Including pain rating scale, medication(s)/side effects and non-pharmacologic comfort measures Outcome: Progressing   Problem: Health Behavior/Discharge Planning: Goal: Ability to manage health-related needs will improve Outcome: Progressing   Problem: Clinical Measurements: Goal: Ability to maintain clinical measurements within normal limits will improve Outcome: Progressing Goal: Will remain free from infection Outcome: Progressing Goal: Diagnostic test results will improve Outcome: Progressing Goal: Cardiovascular complication will be avoided Outcome: Progressing   Problem: Activity: Goal: Risk for activity intolerance will decrease Outcome: Progressing   Problem: Nutrition: Goal: Adequate nutrition will be maintained Outcome: Progressing   Problem: Coping: Goal: Level of anxiety will decrease Outcome: Progressing   Problem: Pain Managment: Goal: General experience of comfort will improve Outcome: Progressing   Problem: Safety: Goal: Ability to remain free from injury will improve Outcome: Progressing   Problem: Skin Integrity: Goal: Risk for impaired skin integrity will decrease Outcome: Progressing

## 2021-03-22 NOTE — Progress Notes (Addendum)
Patient Demographics:    Diane Wise, is a 80 y.o. female, DOB - 12/01/1941, HYI:[id number]  Admit date - 03/21/2021   Admitting Physician Ejiroghene Arlyce Dice, MD  Outpatient Primary MD for the patient is [name], MD  LOS - 1   Chief Complaint  Patient presents with  . Weakness        Subjective:    Diane Wise today has no fevers, no emesis,  No chest pain,   Forgetful, disoriented and confused at baseline  -Called and spoke with husband by phone  Assessment  & Plan :    Principal Problem:   Closed left hip fracture ([place]) Active Problems:   Leukocytosis   Anxiety and depression   Dementia without behavioral disturbance (HCC)   GERD (gastroesophageal reflux disease)   HLD (hyperlipidemia)   Memory disorder  Brief Summary:- 80 y.o. female with medical history significant for GERD,  dementia, Osteoporosis, HLD and depression and anxiety admitted on 03/21/2021  From [facility] after a mechanical fall and found to have left hip fracture  A/p 1)Lt Hip Fx-status post mechanical fall with left hip fracture on 03/21/2021 -Discussed with orthopedic surgeon Dr. Aline Brochure plan for operative fixation on 03/23/2021 -Further management per orthopedic team  2) leukocytosis--may be reactive secondary to hip fracture, UA requested -Chest X-ray without acute finding  3)Depression and anxiety--continue Zoloft 50 mg daily and as needed lorazepam  4) dementia--- at baseline patient has moderate cognitive and memory deficits, PTA was on Aricept--- okay to restart Aricept, stop Seroquel  5)GERD--Protonix as ordered  6)HLD-continue simvastatin  Disposition/Need for in-Hospital Stay- patient unable to be discharged at this time due to --left hip fracture requiring operative fixation*  Status is: Inpatient  Remains inpatient appropriate because:Please see above   Disposition: The patient is  from: SNF ([facility])              Anticipated d/c is to: SNF              Anticipated d/c date is: 2 days              Patient currently is not medically stable to d/c. Barriers: Not Clinically Stable-   Code Status :  -  Code Status: Full Code   Family Communication:   Discussed with Husband Consults  :  ortho  DVT Prophylaxis  :   - SCDs   SCDs Start: 03/21/21 2101    Lab Results  Component Value Date   PLT 182 03/22/2021    Inpatient Medications  Scheduled Meds: . [START ON 03/23/2021] aspirin EC  81 mg Oral Q breakfast  . donepezil  5 mg Oral QHS  . pantoprazole  40 mg Oral Daily  . sertraline  50 mg Oral Daily  . simvastatin  20 mg Oral QHS   Continuous Infusions: . sodium chloride 75 mL/hr at 03/22/21 0916   PRN Meds:.acetaminophen **OR** acetaminophen, LORazepam, morphine injection, ondansetron **OR** ondansetron (ZOFRAN) IV, polyethylene glycol    Anti-infectives (From admission, onward)   None        Objective:   Vitals:   03/21/21 2009 03/21/21 2108 03/22/21 0459 03/22/21 1409  BP: (!) 137/97 (!) 128/91 110/62 (!) 155/73  Pulse:  83 87 82  Resp: (!) 28  20 19 18   Temp:  98.4 F (36.9 C) (!) 97.5 F (36.4 C) 98.5 F (36.9 C)  TempSrc:  Oral  Oral  SpO2:  100% 96% 100%  Weight:      Height:        Wt Readings from Last 3 Encounters:  03/21/21 55.8 kg  07/31/18 55.1 kg  09/25/13 62.8 kg     Intake/Output Summary (Last 24 hours) at 03/22/2021 1815 Last data filed at 03/22/2021 1300 Gross per 24 hour  Intake 0 ml  Output --  Net 0 ml     Physical Exam  Gen:- Awake Alert,  In no apparent distress, pleasantly confused HEENT:- [place].AT, No sclera icterus Neck-Supple Neck,No JVD,.  Lungs-  CTAB , fair symmetrical air movement CV- S1, S2 normal, regular  Abd-  +ve B.Sounds, Abd Soft, No tenderness,    Extremity/Skin:- No  edema, pedal pulses present  Psych-baseline cognitive and memory deficits persist Neuro-Generalized weakness, no new  focal deficits, no tremors MSK-LT LE is shortened and rotated   Data Review:   Micro Results Recent Results (from the past 240 hour(s))  Resp Panel by RT-PCR (Flu A&B, Covid) Nasopharyngeal Swab     Status: None   Collection Time: 03/21/21  7:19 PM   Specimen: Nasopharyngeal Swab; Nasopharyngeal(NP) swabs in vial transport medium  Result Value Ref Range Status   SARS Coronavirus 2 by RT PCR NEGATIVE NEGATIVE Final    Comment: (NOTE) SARS-CoV-2 target nucleic acids are NOT DETECTED.  The SARS-CoV-2 RNA is generally detectable in upper respiratory specimens during the acute phase of infection. The lowest concentration of SARS-CoV-2 viral copies this assay can detect is 138 copies/mL. A negative result does not preclude SARS-Cov-2 infection and should not be used as the sole basis for treatment or other patient management decisions. A negative result may occur with  improper specimen collection/handling, submission of specimen other than nasopharyngeal swab, presence of viral mutation(s) within the areas targeted by this assay, and inadequate number of viral copies(<138 copies/mL). A negative result must be combined with clinical observations, patient history, and epidemiological information. The expected result is Negative.  Fact Sheet for Patients:  EntrepreneurPulse.com.au  Fact Sheet for Healthcare Providers:  IncredibleEmployment.be  This test is no t yet approved or cleared by the [place] FDA and  has been authorized for detection and/or diagnosis of SARS-CoV-2 by FDA under an Emergency Use Authorization (EUA). This EUA will remain  in effect (meaning this test can be used) for the duration of the COVID-19 declaration under Section 564(b)(1) of the Act, 21 U.S.C.section 360bbb-3(b)(1), unless the authorization is terminated  or revoked sooner.       Influenza A by PCR NEGATIVE NEGATIVE Final   Influenza B by PCR NEGATIVE  NEGATIVE Final    Comment: (NOTE) The Xpert Xpress SARS-CoV-2/FLU/RSV plus assay is intended as an aid in the diagnosis of influenza from Nasopharyngeal swab specimens and should not be used as a sole basis for treatment. Nasal washings and aspirates are unacceptable for Xpert Xpress SARS-CoV-2/FLU/RSV testing.  Fact Sheet for Patients: EntrepreneurPulse.com.au  Fact Sheet for Healthcare Providers: IncredibleEmployment.be  This test is not yet approved or cleared by the [place] FDA and has been authorized for detection and/or diagnosis of SARS-CoV-2 by FDA under an Emergency Use Authorization (EUA). This EUA will remain in effect (meaning this test can be used) for the duration of the COVID-19 declaration under Section 564(b)(1) of the Act, 21 U.S.C. section 360bbb-3(b)(1), unless the authorization is  terminated or revoked.  Performed at [facility], [address] [place], [place] [address]     Radiology Reports DG Thoracic Spine W/Swimmers  Result Date: 03/21/2021 CLINICAL DATA:  [place] today. EXAM: THORACIC SPINE - 3 VIEWS COMPARISON:  None. FINDINGS: Age related degenerative changes and exaggerated thoracic kyphosis. No acute bony findings or destructive bony changes. No abnormal paraspinal soft tissue swelling. The visualized lungs are grossly clear. IMPRESSION: Age related degenerative changes but no acute bony findings. Electronically Signed   By: Marijo Sanes M.D.   On: 03/21/2021 16:40   DG Lumbar Spine Complete  Result Date: 03/21/2021 CLINICAL DATA:  [place]. EXAM: LUMBAR SPINE - COMPLETE 4+ VIEW COMPARISON:  None FINDINGS: There is a compression fracture of L1 of uncertain age. The other lumbar vertebral bodies are maintained. No definite pars defects. The visualized bony pelvis is intact. IMPRESSION: L1 compression fracture of uncertain age. Electronically Signed   By: Marijo Sanes M.D.   On: 03/21/2021 16:43   DG Pelvis 1-2  Views  Result Date: 03/21/2021 CLINICAL DATA:  [place].  Left hip pain. EXAM: PELVIS - 1-2 VIEW COMPARISON:  None. FINDINGS: Both hips are normally located. Impacted subcapital/high femoral neck fracture on the left. No right-sided hip fracture. The pubic symphysis and SI joints are intact. No pelvic fractures. IMPRESSION: Impacted subcapital/high femoral neck fracture on the left. Electronically Signed   By: Marijo Sanes M.D.   On: 03/21/2021 16:45   DG Chest Port 1 View  Result Date: 03/21/2021 CLINICAL DATA:  Preop evaluation for left femoral repair EXAM: PORTABLE CHEST 1 VIEW COMPARISON:  02/16/2006 FINDINGS: Cardiac shadow is within normal limits. Small hiatal hernia is noted. Aortic calcifications are seen. Chronic left proximal humeral fracture is noted. Scarring is noted in the upper lobes bilaterally without focal infiltrate. No acute bony abnormality is seen. IMPRESSION: Chronic changes without acute abnormality. Electronically Signed   By: Inez Catalina M.D.   On: 03/21/2021 19:06   DG HIP UNILAT WITH PELVIS 2-3 VIEWS LEFT  Result Date: 03/22/2021 CLINICAL DATA:  Preop planning after fall. EXAM: DG HIP (WITH OR WITHOUT PELVIS) 2-3V LEFT COMPARISON:  One day prior FINDINGS: Again identified is a subcapital femoral neck fracture with impaction and varus angulation. No dislocation. IMPRESSION: Left femoral neck fracture, as before. Electronically Signed   By: Abigail Miyamoto M.D.   On: 03/22/2021 14:29   DG FEMUR MIN 2 VIEWS LEFT  Result Date: 03/21/2021 CLINICAL DATA:  [place].  Left hip pain. EXAM: LEFT FEMUR 2 VIEWS COMPARISON:  None. FINDINGS: There is a mildly displaced and impacted subcapital/high femoral neck fracture. No fracture of the femoral shaft. The knee joint is unremarkable. IMPRESSION: Mildly displaced and impacted subcapital/high femoral neck fracture. Electronically Signed   By: Marijo Sanes M.D.   On: 03/21/2021 16:44     CBC Recent Labs  Lab 03/21/21 1624 03/22/21 0613  WBC  15.5* 12.0*  HGB 15.1* 14.4  HCT 47.1* 45.5  PLT 197 182  MCV 88.9 88.3  MCH 28.5 28.0  MCHC 32.1 31.6  RDW 14.3 14.1    Chemistries  Recent Labs  Lab 03/21/21 1624  NA 135  K 3.9  CL 103  CO2 22  GLUCOSE 128*  BUN 14  CREATININE 0.74  CALCIUM 8.7*  AST 27  ALT 22  ALKPHOS 64  BILITOT 0.6   ------------------------------------------------------------------------------------------------------------------ No results for input(s): CHOL, HDL, LDLCALC, TRIG, CHOLHDL, LDLDIRECT in the last 72 hours.  No results found for: HGBA1C ------------------------------------------------------------------------------------------------------------------  No results for input(s): TSH, T4TOTAL, T3FREE, THYROIDAB in the last 72 hours.  Invalid input(s): FREET3 ------------------------------------------------------------------------------------------------------------------ No results for input(s): VITAMINB12, FOLATE, FERRITIN, TIBC, IRON, RETICCTPCT in the last 72 hours.  Coagulation profile Recent Labs  Lab 03/21/21 1854  INR 1.2    No results for input(s): DDIMER in the last 72 hours.  Cardiac Enzymes No results for input(s): CKMB, TROPONINI, MYOGLOBIN in the last 168 hours.  Invalid input(s): CK ------------------------------------------------------------------------------------------------------------------ No results found for: BNP   Roxan Hockey M.D on 03/22/2021 at 6:15 PM  Go to [email or web address] - for contact info  [facility] - Office  [phone]

## 2021-03-22 NOTE — H&P (View-Only) (Signed)
[place]   Patient ID: Diane Wise, female   DOB: 1941-02-10, 80 y.o.   MRN: [id number]  New patient  Requested by:Dr Courage Emokepe  Reason for: left hip fracture   Based on the information below I recommend left partial hip replacement   Chief Complaint  Patient presents with  .  Pain left hip     HPI  80 year old female hard of hearing fell and fractured her hip on 03/21/2021 complains of severe pain right hip unable to walk pain increases with mobility  Review of Systems (all) Review of Systems  Unable to perform ROS: Acuity of condition    Past Medical History:  Diagnosis Date  . Complication of anesthesia    SLOW TO WAKE UP  . GERD (gastroesophageal reflux disease)   . Memory disorder 07/31/2018  . Osteoporosis     Past Surgical History:  Procedure Laterality Date  . ABDOMINAL HYSTERECTOMY  1984  . APPENDECTOMY  05/20/13  . LAPAROSCOPIC APPENDECTOMY N/A 05/20/2013   Procedure: APPENDECTOMY LAPAROSCOPIC;  Surgeon: Leighton Ruff, MD;  Location: [facility];  Service: General;  Laterality: N/A;  . TONSILLECTOMY      Family History  Problem Relation Age of Onset  . Cancer Father   . Diabetes Sister   . Diabetes Brother    Social History   Tobacco Use  . Smoking status: Never Smoker  . Smokeless tobacco: Never Used  Vaping Use  . Vaping Use: Never used  Substance Use Topics  . Alcohol use: No  . Drug use: No   Allergies  Allergen Reactions  . Codeine Nausea And Vomiting  . Sulfa Antibiotics Nausea And Vomiting    Current Facility-Administered Medications:  .  acetaminophen (TYLENOL) tablet 650 mg, 650 mg, Oral, Q6H PRN **OR** acetaminophen (TYLENOL) suppository 650 mg, 650 mg, Rectal, Q6H PRN, Emokpae, Ejiroghene E, MD .  lactated ringers infusion, , Intravenous, Continuous, Emokpae, Ejiroghene E, MD, Last Rate: 100 mL/hr at 03/21/21 2141, New Bag at 03/21/21 2141 .  LORazepam (ATIVAN) tablet 0.5 mg, 0.5 mg, Oral, Q8H  PRN, Emokpae, Ejiroghene E, MD, 0.5 mg at 03/21/21 2136 .  morphine 2 MG/ML injection 2 mg, 2 mg, Intravenous, Q4H PRN, Emokpae, Ejiroghene E, MD, 2 mg at 03/22/21 0510 .  ondansetron (ZOFRAN) tablet 4 mg, 4 mg, Oral, Q6H PRN **OR** ondansetron (ZOFRAN) injection 4 mg, 4 mg, Intravenous, Q6H PRN, Emokpae, Ejiroghene E, MD .  polyethylene glycol (MIRALAX / GLYCOLAX) packet 17 g, 17 g, Oral, Daily PRN, Emokpae, Ejiroghene E, MD .  QUEtiapine (SEROQUEL) tablet 25 mg, 25 mg, Oral, QHS PRN, Emokpae, Ejiroghene E, MD, 25 mg at 03/21/21 2136 .  sertraline (ZOLOFT) tablet 50 mg, 50 mg, Oral, Daily, Emokpae, Ejiroghene E, MD    Physical Exam(=30) BP 110/62 (BP Location: Right Arm)   Pulse 87   Temp (!) 97.5 F (36.4 C)   Resp 19   Ht 5\' 4"  (1.626 m)   Wt 55.8 kg   SpO2 96%   BMI 21.11 kg/m   Gen. Appearance normal thin female Peripheral vascular system good distal pulses normal color and capillary refill Lymph nodes ARE NORMAL  Gait unable to ambulate secondary to hip fracture  Left Upper extremity  Inspection revealed no malalignment or asymmetry  Assessment of range of motion: Full range of motion was recorded  Assessment of stability: Elbow wrist and hand and shoulder were stable  Assessment of muscle strength and tone revealed grade 5 muscle strength and normal  muscle tone  Skin was normal without rash lesion or ulceration  Right upper extremity  Inspection revealed no malalignment or asymmetry  Assessment of range of motion: Full range of motion was recorded  Assessment of stability: Elbow wrist and hand and shoulder were stable  Assessment of muscle strength and tone revealed grade 5 muscle strength and normal muscle tone  Skin was normal without rash lesion or ulceration  Right Lower extremity  Inspection revealed no malalignment or asymmetry  Assessment of range of motion: Full range of motion was recorded  Assessment of stability: Ankle, knee and hip were stable   Assessment of muscle strength and tone revealed grade 5 muscle strength and normal muscle tone  Skin was normal without rash lesion or ulceration  Left lower extremity Left lower extremity slightly shortened externally rotated tenderness proximally skin is intact range of motion deferred because of pain and fracture knee looks reduced as is the ankle muscle tone normal   Coordination was tested by finger-to-nose nose and was normal Deep tendon reflexes were 2+ in the upper extremities lower extremity exam deferred because of pain  examination of sensation by touch was normal  Mental status  Oriented to time person and place normal  Mood and affect normal without depression anxiety or agitation  Dx:   Data Reviewed  ER RECORD REVIEWED: CONFIRMS HISTORY   I reviewed the following images and the reports and my independent interpretation is left femoral neck fracture complete displacement.  Also reviewed and read or read pelvic fracture as left femoral neck fracture with complete displacement left femur fracture 2 views no distal femur fracture   Assessment  Complete fracture left femoral neck, closed  Plan   Partial replacement left hip  The procedure has been fully reviewed with the patient; The risks and benefits of surgery have been discussed and explained and understood. Alternative treatment has also been reviewed, questions were encouraged and answered. The postoperative plan is also been reviewed.    Carole Civil MD

## 2021-03-22 NOTE — Progress Notes (Signed)
Patient ID: Diane Wise, female   DOB: 1940-12-21, 80 y.o.   MRN: [id number] 80 year old female left femoral neck fracture  Dedicated hip films were not done in the emergency room yesterday and will have to be done today to classify the fracture and determine treatment  The patient will be operated on on Thursday  I will see the patient later today

## 2021-03-22 NOTE — Consult Note (Signed)
[place]   Patient ID: Diane Wise, female   DOB: 1941-06-01, 80 y.o.   MRN: [id number]  New patient  Requested by:Dr Courage Emokepe  Reason for: left hip fracture   Based on the information below I recommend left partial hip replacement   Chief Complaint  Patient presents with  .  Pain left hip     HPI  80 year old female hard of hearing fell and fractured her hip on 03/21/2021 complains of severe pain right hip unable to walk pain increases with mobility  Review of Systems (all) Review of Systems  Unable to perform ROS: Acuity of condition    Past Medical History:  Diagnosis Date  . Complication of anesthesia    SLOW TO WAKE UP  . GERD (gastroesophageal reflux disease)   . Memory disorder 07/31/2018  . Osteoporosis     Past Surgical History:  Procedure Laterality Date  . ABDOMINAL HYSTERECTOMY  1984  . APPENDECTOMY  05/20/13  . LAPAROSCOPIC APPENDECTOMY N/A 05/20/2013   Procedure: APPENDECTOMY LAPAROSCOPIC;  Surgeon: Leighton Ruff, MD;  Location: [facility];  Service: General;  Laterality: N/A;  . TONSILLECTOMY      Family History  Problem Relation Age of Onset  . Cancer Father   . Diabetes Sister   . Diabetes Brother    Social History   Tobacco Use  . Smoking status: Never Smoker  . Smokeless tobacco: Never Used  Vaping Use  . Vaping Use: Never used  Substance Use Topics  . Alcohol use: No  . Drug use: No   Allergies  Allergen Reactions  . Codeine Nausea And Vomiting  . Sulfa Antibiotics Nausea And Vomiting    Current Facility-Administered Medications:  .  acetaminophen (TYLENOL) tablet 650 mg, 650 mg, Oral, Q6H PRN **OR** acetaminophen (TYLENOL) suppository 650 mg, 650 mg, Rectal, Q6H PRN, Emokpae, Ejiroghene E, MD .  lactated ringers infusion, , Intravenous, Continuous, Emokpae, Ejiroghene E, MD, Last Rate: 100 mL/hr at 03/21/21 2141, New Bag at 03/21/21 2141 .  LORazepam (ATIVAN) tablet 0.5 mg, 0.5 mg, Oral, Q8H  PRN, Emokpae, Ejiroghene E, MD, 0.5 mg at 03/21/21 2136 .  morphine 2 MG/ML injection 2 mg, 2 mg, Intravenous, Q4H PRN, Emokpae, Ejiroghene E, MD, 2 mg at 03/22/21 0510 .  ondansetron (ZOFRAN) tablet 4 mg, 4 mg, Oral, Q6H PRN **OR** ondansetron (ZOFRAN) injection 4 mg, 4 mg, Intravenous, Q6H PRN, Emokpae, Ejiroghene E, MD .  polyethylene glycol (MIRALAX / GLYCOLAX) packet 17 g, 17 g, Oral, Daily PRN, Emokpae, Ejiroghene E, MD .  QUEtiapine (SEROQUEL) tablet 25 mg, 25 mg, Oral, QHS PRN, Emokpae, Ejiroghene E, MD, 25 mg at 03/21/21 2136 .  sertraline (ZOLOFT) tablet 50 mg, 50 mg, Oral, Daily, Emokpae, Ejiroghene E, MD    Physical Exam(=30) BP 110/62 (BP Location: Right Arm)   Pulse 87   Temp (!) 97.5 F (36.4 C)   Resp 19   Ht 5\' 4"  (1.626 m)   Wt 55.8 kg   SpO2 96%   BMI 21.11 kg/m   Gen. Appearance normal thin female Peripheral vascular system good distal pulses normal color and capillary refill Lymph nodes ARE NORMAL  Gait unable to ambulate secondary to hip fracture  Left Upper extremity  Inspection revealed no malalignment or asymmetry  Assessment of range of motion: Full range of motion was recorded  Assessment of stability: Elbow wrist and hand and shoulder were stable  Assessment of muscle strength and tone revealed grade 5 muscle strength and normal  muscle tone  Skin was normal without rash lesion or ulceration  Right upper extremity  Inspection revealed no malalignment or asymmetry  Assessment of range of motion: Full range of motion was recorded  Assessment of stability: Elbow wrist and hand and shoulder were stable  Assessment of muscle strength and tone revealed grade 5 muscle strength and normal muscle tone  Skin was normal without rash lesion or ulceration  Right Lower extremity  Inspection revealed no malalignment or asymmetry  Assessment of range of motion: Full range of motion was recorded  Assessment of stability: Ankle, knee and hip were stable   Assessment of muscle strength and tone revealed grade 5 muscle strength and normal muscle tone  Skin was normal without rash lesion or ulceration  Left lower extremity Left lower extremity slightly shortened externally rotated tenderness proximally skin is intact range of motion deferred because of pain and fracture knee looks reduced as is the ankle muscle tone normal   Coordination was tested by finger-to-nose nose and was normal Deep tendon reflexes were 2+ in the upper extremities lower extremity exam deferred because of pain  examination of sensation by touch was normal  Mental status  Oriented to time person and place normal  Mood and affect normal without depression anxiety or agitation  Dx:   Data Reviewed  ER RECORD REVIEWED: CONFIRMS HISTORY   I reviewed the following images and the reports and my independent interpretation is left femoral neck fracture complete displacement.  Also reviewed and read or read pelvic fracture as left femoral neck fracture with complete displacement left femur fracture 2 views no distal femur fracture   Assessment  Complete fracture left femoral neck, closed  Plan   Partial replacement left hip  The procedure has been fully reviewed with the patient; The risks and benefits of surgery have been discussed and explained and understood. Alternative treatment has also been reviewed, questions were encouraged and answered. The postoperative plan is also been reviewed.    Carole Civil MD

## 2021-03-23 ENCOUNTER — Encounter (HOSPITAL_COMMUNITY)
Admission: EM | Disposition: A | Discharge status: Skilled Nursing Facility | Payer: Self-pay | Source: Home / Self Care | Attending: Family Medicine

## 2021-03-23 ENCOUNTER — Encounter (HOSPITAL_COMMUNITY): Payer: Self-pay | Admitting: Internal Medicine

## 2021-03-23 ENCOUNTER — Inpatient Hospital Stay (HOSPITAL_COMMUNITY): Payer: Medicare Other | Admitting: Certified Registered"

## 2021-03-23 ENCOUNTER — Inpatient Hospital Stay (HOSPITAL_COMMUNITY): Payer: Medicare Other

## 2021-03-23 DIAGNOSIS — S72002A Fracture of unspecified part of neck of left femur, initial encounter for closed fracture: Secondary | ICD-10-CM | POA: Diagnosis not present

## 2021-03-23 DIAGNOSIS — D72829 Elevated white blood cell count, unspecified: Secondary | ICD-10-CM

## 2021-03-23 HISTORY — PX: HIP ARTHROPLASTY: SHX981

## 2021-03-23 LAB — CBC
HCT: 43.7 % (ref 36.0–46.0)
Hemoglobin: 14.1 g/dL (ref 12.0–15.0)
MCH: 28.7 pg (ref 26.0–34.0)
MCHC: 32.3 g/dL (ref 30.0–36.0)
MCV: 88.8 fL (ref 80.0–100.0)
Platelets: 165 10*3/uL (ref 150–400)
RBC: 4.92 MIL/uL (ref 3.87–5.11)
RDW: 14.4 % (ref 11.5–15.5)
WBC: 10.2 10*3/uL (ref 4.0–10.5)
nRBC: 0 % (ref 0.0–0.2)

## 2021-03-23 LAB — GLUCOSE, CAPILLARY
Glucose-Capillary: 110 mg/dL — ABNORMAL HIGH (ref 70–99)
Glucose-Capillary: 123 mg/dL — ABNORMAL HIGH (ref 70–99)
Glucose-Capillary: 166 mg/dL — ABNORMAL HIGH (ref 70–99)

## 2021-03-23 SURGERY — HEMIARTHROPLASTY, HIP, DIRECT ANTERIOR APPROACH, FOR FRACTURE
Anesthesia: Spinal | Site: Hip | Laterality: Left

## 2021-03-23 MED ORDER — CHLORHEXIDINE GLUCONATE CLOTH 2 % EX PADS
6.0000 | MEDICATED_PAD | Freq: Every day | CUTANEOUS | Status: DC
  Administered 2021-03-24 – 2021-03-25 (×2): 6 via TOPICAL

## 2021-03-23 MED ORDER — ONDANSETRON HCL 4 MG/2ML IJ SOLN
INTRAMUSCULAR | Status: AC
  Filled 2021-03-23: qty 2

## 2021-03-23 MED ORDER — CEFAZOLIN SODIUM-DEXTROSE 2-4 GM/100ML-% IV SOLN
2.0000 g | INTRAVENOUS | Status: AC
  Administered 2021-03-23: 2 g via INTRAVENOUS

## 2021-03-23 MED ORDER — LIDOCAINE 2% (20 MG/ML) 5 ML SYRINGE
INTRAMUSCULAR | Status: DC | PRN
  Administered 2021-03-23: 40 mg via INTRAVENOUS

## 2021-03-23 MED ORDER — EPINEPHRINE PF 1 MG/ML IJ SOLN
INTRAMUSCULAR | Status: AC
  Filled 2021-03-23: qty 1

## 2021-03-23 MED ORDER — DOCUSATE SODIUM 100 MG PO CAPS
100.0000 mg | ORAL_CAPSULE | Freq: Two times a day (BID) | ORAL | Status: DC
  Administered 2021-03-23 – 2021-03-24 (×2): 100 mg via ORAL
  Filled 2021-03-23 (×2): qty 1

## 2021-03-23 MED ORDER — CEFAZOLIN SODIUM-DEXTROSE 2-4 GM/100ML-% IV SOLN
INTRAVENOUS | Status: AC
  Administered 2021-03-23: 2 g via INTRAVENOUS
  Filled 2021-03-23: qty 100

## 2021-03-23 MED ORDER — EPHEDRINE 5 MG/ML INJ
INTRAVENOUS | Status: AC
  Filled 2021-03-23: qty 10

## 2021-03-23 MED ORDER — DEXAMETHASONE SODIUM PHOSPHATE 10 MG/ML IJ SOLN
INTRAMUSCULAR | Status: AC
  Filled 2021-03-23: qty 1

## 2021-03-23 MED ORDER — CHLORHEXIDINE GLUCONATE 0.12 % MT SOLN
OROMUCOSAL | Status: AC
  Filled 2021-03-23: qty 15

## 2021-03-23 MED ORDER — CHLORHEXIDINE GLUCONATE 0.12 % MT SOLN: 15.0000 mL | Freq: Once | OROMUCOSAL | Status: DC

## 2021-03-23 MED ORDER — DEXAMETHASONE SODIUM PHOSPHATE 4 MG/ML IJ SOLN
INTRAMUSCULAR | Status: DC | PRN
  Administered 2021-03-23: 4 mg via INTRAVENOUS

## 2021-03-23 MED ORDER — METOCLOPRAMIDE HCL 5 MG/ML IJ SOLN: 5.0000 mg | Freq: Three times a day (TID) | INTRAMUSCULAR | Status: DC | PRN

## 2021-03-23 MED ORDER — FENTANYL CITRATE (PF) 100 MCG/2ML IJ SOLN
INTRAMUSCULAR | Status: AC
  Filled 2021-03-23: qty 2

## 2021-03-23 MED ORDER — BUPIVACAINE LIPOSOME 1.3 % IJ SUSP
INTRAMUSCULAR | Status: AC
  Filled 2021-03-23: qty 20

## 2021-03-23 MED ORDER — 0.9 % SODIUM CHLORIDE (POUR BTL) OPTIME
TOPICAL | Status: DC | PRN
  Administered 2021-03-23: 1000 mL

## 2021-03-23 MED ORDER — CEFAZOLIN SODIUM-DEXTROSE 2-4 GM/100ML-% IV SOLN
2.0000 g | Freq: Four times a day (QID) | INTRAVENOUS | Status: AC
  Administered 2021-03-24: 2 g via INTRAVENOUS
  Filled 2021-03-23 (×2): qty 100

## 2021-03-23 MED ORDER — FENTANYL CITRATE (PF) 100 MCG/2ML IJ SOLN
INTRAMUSCULAR | Status: DC | PRN
  Administered 2021-03-23: 25 ug via INTRATHECAL

## 2021-03-23 MED ORDER — FENTANYL CITRATE (PF) 100 MCG/2ML IJ SOLN
INTRAMUSCULAR | Status: DC | PRN
  Administered 2021-03-23: 50 ug via INTRAVENOUS

## 2021-03-23 MED ORDER — ONDANSETRON HCL 4 MG/2ML IJ SOLN: 4.0000 mg | Freq: Four times a day (QID) | INTRAMUSCULAR | Status: DC | PRN

## 2021-03-23 MED ORDER — SENNOSIDES-DOCUSATE SODIUM 8.6-50 MG PO TABS: 1.0000 | ORAL_TABLET | Freq: Every evening | ORAL | Status: DC | PRN

## 2021-03-23 MED ORDER — POVIDONE-IODINE 10 % EX SWAB: 2.0000 "application " | Freq: Once | CUTANEOUS | Status: DC

## 2021-03-23 MED ORDER — BUPIVACAINE LIPOSOME 1.3 % IJ SUSP
INTRAMUSCULAR | Status: DC | PRN
  Administered 2021-03-23: 20 mL

## 2021-03-23 MED ORDER — MENTHOL 3 MG MT LOZG: 1.0000 | LOZENGE | OROMUCOSAL | Status: DC | PRN

## 2021-03-23 MED ORDER — PHENYLEPHRINE 40 MCG/ML (10ML) SYRINGE FOR IV PUSH (FOR BLOOD PRESSURE SUPPORT)
PREFILLED_SYRINGE | INTRAVENOUS | Status: AC
  Filled 2021-03-23: qty 10

## 2021-03-23 MED ORDER — ACETAMINOPHEN 325 MG PO TABS: 325.0000 mg | ORAL_TABLET | Freq: Four times a day (QID) | ORAL | Status: DC | PRN

## 2021-03-23 MED ORDER — EPINEPHRINE 1 MG/10ML IJ SOSY
PREFILLED_SYRINGE | INTRAMUSCULAR | Status: DC | PRN
  Administered 2021-03-23: .01 mg via INTRATRACHEAL

## 2021-03-23 MED ORDER — ONDANSETRON HCL 4 MG PO TABS: 4.0000 mg | ORAL_TABLET | Freq: Four times a day (QID) | ORAL | Status: DC | PRN

## 2021-03-23 MED ORDER — PHENYLEPHRINE HCL-NACL 10-0.9 MG/250ML-% IV SOLN
INTRAVENOUS | Status: AC
  Filled 2021-03-23: qty 250

## 2021-03-23 MED ORDER — PHENYLEPHRINE HCL-NACL 10-0.9 MG/250ML-% IV SOLN
INTRAVENOUS | Status: DC | PRN
  Administered 2021-03-23: 50 ug/min via INTRAVENOUS

## 2021-03-23 MED ORDER — MORPHINE SULFATE (PF) 2 MG/ML IV SOLN: 0.5000 mg | INTRAVENOUS | Status: DC | PRN

## 2021-03-23 MED ORDER — FENTANYL CITRATE (PF) 100 MCG/2ML IJ SOLN: 25.0000 ug | INTRAMUSCULAR | Status: DC | PRN

## 2021-03-23 MED ORDER — ONDANSETRON HCL 4 MG/2ML IJ SOLN
INTRAMUSCULAR | Status: DC | PRN
  Administered 2021-03-23: 4 mg via INTRAVENOUS

## 2021-03-23 MED ORDER — BUPIVACAINE IN DEXTROSE 0.75-8.25 % IT SOLN
INTRATHECAL | Status: DC | PRN
  Administered 2021-03-23: 11.25 mg via INTRATHECAL

## 2021-03-23 MED ORDER — PROPOFOL 500 MG/50ML IV EMUL
INTRAVENOUS | Status: DC | PRN
  Administered 2021-03-23: 50 ug/kg/min via INTRAVENOUS

## 2021-03-23 MED ORDER — LACTATED RINGERS IV SOLN: INTRAVENOUS | Status: DC

## 2021-03-23 MED ORDER — PROPOFOL 10 MG/ML IV BOLUS
INTRAVENOUS | Status: DC | PRN
  Administered 2021-03-23 (×2): 25 mg via INTRAVENOUS

## 2021-03-23 MED ORDER — SODIUM CHLORIDE 0.9 % IR SOLN
Status: DC | PRN
  Administered 2021-03-23: 3000 mL

## 2021-03-23 MED ORDER — PHENYLEPHRINE 40 MCG/ML (10ML) SYRINGE FOR IV PUSH (FOR BLOOD PRESSURE SUPPORT)
PREFILLED_SYRINGE | INTRAVENOUS | Status: DC | PRN
  Administered 2021-03-23: 120 ug via INTRAVENOUS
  Administered 2021-03-23: 80 ug via INTRAVENOUS
  Administered 2021-03-23: 120 ug via INTRAVENOUS
  Administered 2021-03-23: 80 ug via INTRAVENOUS

## 2021-03-23 MED ORDER — TRAMADOL HCL 50 MG PO TABS
50.0000 mg | ORAL_TABLET | Freq: Four times a day (QID) | ORAL | Status: DC
  Administered 2021-03-23 – 2021-03-27 (×7): 50 mg via ORAL
  Filled 2021-03-23 (×7): qty 1

## 2021-03-23 MED ORDER — BUPIVACAINE-EPINEPHRINE (PF) 0.5% -1:200000 IJ SOLN
INTRAMUSCULAR | Status: AC
  Filled 2021-03-23: qty 30

## 2021-03-23 MED ORDER — ONDANSETRON HCL 4 MG/2ML IJ SOLN: 4.0000 mg | Freq: Once | INTRAMUSCULAR | Status: DC | PRN

## 2021-03-23 MED ORDER — ASPIRIN EC 325 MG PO TBEC
325.0000 mg | DELAYED_RELEASE_TABLET | Freq: Every day | ORAL | Status: DC
  Administered 2021-03-24 – 2021-03-25 (×2): 325 mg via ORAL
  Filled 2021-03-23 (×4): qty 1

## 2021-03-23 MED ORDER — ORAL CARE MOUTH RINSE: 15.0000 mL | Freq: Once | OROMUCOSAL | Status: DC

## 2021-03-23 MED ORDER — PROPOFOL 10 MG/ML IV BOLUS
INTRAVENOUS | Status: AC
  Filled 2021-03-23: qty 40

## 2021-03-23 MED ORDER — EPHEDRINE SULFATE-NACL 50-0.9 MG/10ML-% IV SOSY
PREFILLED_SYRINGE | INTRAVENOUS | Status: DC | PRN
  Administered 2021-03-23 (×2): 5 mg via INTRAVENOUS

## 2021-03-23 MED ORDER — PHENOL 1.4 % MT LIQD: 1.0000 | OROMUCOSAL | Status: DC | PRN

## 2021-03-23 MED ORDER — METOCLOPRAMIDE HCL 10 MG PO TABS: 5.0000 mg | ORAL_TABLET | Freq: Three times a day (TID) | ORAL | Status: DC | PRN

## 2021-03-23 SURGICAL SUPPLY — 56 items
APL PRP STRL LF DISP 70% ISPRP (MISCELLANEOUS) ×1
BALL HIP ARTICU 28 +5 (Hips) IMPLANT
BIPOLAR PROS AML 45 (Hips) ×2 IMPLANT
BIT DRILL 2.8X128 (BIT) ×2 IMPLANT
BLADE SAGITTAL 25.0X1.27X90 (BLADE) ×2 IMPLANT
CHLORAPREP W/TINT 26 (MISCELLANEOUS) ×2 IMPLANT
CLOTH BEACON ORANGE TIMEOUT ST (SAFETY) ×2 IMPLANT
COVER LIGHT HANDLE STERIS (MISCELLANEOUS) ×4 IMPLANT
COVER WAND RF STERILE (DRAPES) ×2 IMPLANT
DECANTER SPIKE VIAL GLASS SM (MISCELLANEOUS) ×2 IMPLANT
DRAPE HIP W/POCKET STRL (MISCELLANEOUS) ×2 IMPLANT
DRAPE U-SHAPE 47X51 STRL (DRAPES) ×2 IMPLANT
DRSG MEPILEX BORDER 4X12 (GAUZE/BANDAGES/DRESSINGS) ×2 IMPLANT
DRSG MEPILEX SACRM 8.7X9.8 (GAUZE/BANDAGES/DRESSINGS) ×2 IMPLANT
ELECT REM PT RETURN 9FT ADLT (ELECTROSURGICAL) ×2
ELECTRODE REM PT RTRN 9FT ADLT (ELECTROSURGICAL) ×1 IMPLANT
GLOVE SKINSENSE NS SZ8.0 LF (GLOVE) ×2
GLOVE SKINSENSE STRL SZ8.0 LF (GLOVE) ×2 IMPLANT
GLOVE SS N UNI LF 8.5 STRL (GLOVE) ×2 IMPLANT
GLOVE SURG UNDER POLY LF SZ7 (GLOVE) ×8 IMPLANT
GOWN STRL REUS W/TWL LRG LVL3 (GOWN DISPOSABLE) ×6 IMPLANT
GOWN STRL REUS W/TWL XL LVL3 (GOWN DISPOSABLE) ×2 IMPLANT
HANDPIECE INTERPULSE COAX TIP (DISPOSABLE) ×2
HEAD BIPOLAR PROS AML 45 (Hips) IMPLANT
HIP BALL ARTICU 28 +5 (Hips) ×2 IMPLANT
INST SET MAJOR BONE (KITS) ×2 IMPLANT
KIT BLADEGUARD II DBL (SET/KITS/TRAYS/PACK) ×2 IMPLANT
KIT TURNOVER KIT A (KITS) ×2 IMPLANT
MANIFOLD NEPTUNE II (INSTRUMENTS) ×2 IMPLANT
MARKER SKIN DUAL TIP RULER LAB (MISCELLANEOUS) ×2 IMPLANT
NDL HYPO 18GX1.5 BLUNT FILL (NEEDLE) ×1 IMPLANT
NDL HYPO 21X1.5 SAFETY (NEEDLE) ×1 IMPLANT
NEEDLE HYPO 18GX1.5 BLUNT FILL (NEEDLE) ×2 IMPLANT
NEEDLE HYPO 21X1.5 SAFETY (NEEDLE) ×2 IMPLANT
NS IRRIG 1000ML POUR BTL (IV SOLUTION) ×2 IMPLANT
PACK TOTAL JOINT (CUSTOM PROCEDURE TRAY) ×2 IMPLANT
PAD ARMBOARD 7.5X6 YLW CONV (MISCELLANEOUS) ×2 IMPLANT
PASSER SUT SWANSON 36MM LOOP (INSTRUMENTS) IMPLANT
PENCIL SMOKE EVACUATOR (MISCELLANEOUS) ×2 IMPLANT
PIN STMN SNGL STERILE 9X3.6MM (PIN) ×4 IMPLANT
SET BASIN LINEN APH (SET/KITS/TRAYS/PACK) ×2 IMPLANT
SET HNDPC FAN SPRY TIP SCT (DISPOSABLE) ×1 IMPLANT
STAPLER VISISTAT 35W (STAPLE) ×2 IMPLANT
STEM SUMMIT BASIC PRESSFIT SZ4 (Hips) ×1 IMPLANT
SUT BRALON NAB BRD #1 30IN (SUTURE) ×4 IMPLANT
SUT ETHIBOND 5 LR DA (SUTURE) ×4 IMPLANT
SUT MNCRL 0 VIOLET CTX 36 (SUTURE) ×1 IMPLANT
SUT MON AB 2-0 CT1 36 (SUTURE) ×2 IMPLANT
SUT MONOCRYL 0 CTX 36 (SUTURE) ×2
SUT VIC AB 1 CT1 27 (SUTURE) ×10
SUT VIC AB 1 CT1 27XBRD ANTBC (SUTURE) ×2 IMPLANT
SYR 20ML LL LF (SYRINGE) ×6 IMPLANT
SYR BULB IRRIG 60ML STRL (SYRINGE) ×2 IMPLANT
TRAY FOLEY MTR SLVR 16FR STAT (SET/KITS/TRAYS/PACK) ×2 IMPLANT
WATER STERILE IRR 1000ML POUR (IV SOLUTION) ×4 IMPLANT
YANKAUER SUCT 12FT TUBE ARGYLE (SUCTIONS) ×2 IMPLANT

## 2021-03-23 NOTE — Progress Notes (Signed)
Patient Demographics:    Diane Wise, is a 80 y.o. female, DOB - 07-Feb-1941, SAY:[id number]  Admit date - 03/21/2021   Admitting Physician Ejiroghene Arlyce Dice, MD  Outpatient Primary MD for the patient is Diane Housekeeper, MD  LOS - 2   Chief Complaint  Patient presents with  . Weakness        Subjective:    [name] today has no fevers, no emesis,  No chest pain,   -Remains pleasantly confused -Going to the OR today for left hip surgery  Assessment  & Plan :    Principal Problem:   Closed left hip fracture (HCC) Active Problems:   Leukocytosis   Anxiety and depression   Dementia without behavioral disturbance (HCC)   GERD (gastroesophageal reflux disease)   HLD (hyperlipidemia)   Memory disorder  Brief Summary:- 80 y.o. female with medical history significant for GERD,  dementia, Osteoporosis, HLD and depression and anxiety admitted on 03/21/2021  From [facility] after a mechanical fall and found to have left hip fracture  A/p 1)Lt Hip Fx-status post mechanical fall with left hip fracture on 03/21/2021 -Discussed with orthopedic surgeon Dr. Aline Brochure  ----plan for operative fixation on 03/23/2021 -Further management per orthopedic team  2) leukocytosis--may be reactive secondary to hip fracture, UA without evidence of UTI-Chest X-ray without acute finding -WBC has normalized  3)Depression and anxiety--continue Zoloft 50 mg daily and as needed lorazepam  4) dementia--- at baseline patient has moderate cognitive and memory deficits, PTA was on Aricept--- continue Aricept, stopped Seroquel  5)GERD--Protonix as ordered  6)HLD-continue simvastatin  Disposition/Need for in-Hospital Stay- patient unable to be discharged at this time due to --left hip fracture requiring operative fixation*  Status is: Inpatient  Remains inpatient appropriate because:Please see  above   Disposition: The patient is from: SNF ([facility])              Anticipated d/c is to: SNF              Anticipated d/c date is: 2 days              Patient currently is not medically stable to d/c. Barriers: Not Clinically Stable-   Code Status :  -  Code Status: Full Code   Family Communication:   Discussed with Husband Consults  :  ortho  DVT Prophylaxis  :   - SCDs   SCDs Start: 03/21/21 2101 -Postop DVT prophylaxis per orthopedic surgeon   Lab Results  Component Value Date   PLT 165 03/23/2021    Inpatient Medications  Scheduled Meds: . [MAR Hold] aspirin EC  81 mg Oral Q breakfast  . chlorhexidine  15 mL Mouth/Throat Once   Or  . mouth rinse  15 mL Mouth Rinse Once  . chlorhexidine      . [MAR Hold] donepezil  5 mg Oral QHS  . [MAR Hold] pantoprazole  40 mg Oral Daily  . povidone-iodine  2 application Topical Once  . [MAR Hold] sertraline  50 mg Oral Daily  . [MAR Hold] simvastatin  20 mg Oral QHS   Continuous Infusions: . sodium chloride 75 mL/hr at 03/23/21 1232  . ceFAZolin    .  ceFAZolin (ANCEF) IV    . lactated  ringers     PRN Meds:.[MAR Hold] acetaminophen **OR** [MAR Hold] acetaminophen, [MAR Hold] LORazepam, [MAR Hold]  morphine injection, [MAR Hold] ondansetron **OR** [MAR Hold] ondansetron (ZOFRAN) IV, [MAR Hold] polyethylene glycol    Anti-infectives (From admission, onward)   Start     Dose/Rate Route Frequency Ordered Stop   03/23/21 1204  ceFAZolin (ANCEF) 2-4 GM/100ML-% IVPB       Note to Pharmacy: Abbie Sons   : cabinet override      03/23/21 1204 03/24/21 0014   03/23/21 1130  ceFAZolin (ANCEF) IVPB 2g/100 mL premix        2 g 200 mL/hr over 30 Minutes Intravenous On call to O.R. 03/23/21 1125 03/24/21 0559        Objective:   Vitals:   03/22/21 1409 03/22/21 2105 03/23/21 0559 03/23/21 1159  BP: (!) 155/73 (!) 107/98 117/82 (!) 142/76  Pulse: 82 92 66 88  Resp: 18 19 20  (!) 23  Temp: 98.5 F (36.9 C) (!) 97.5 F  (36.4 C) 98 F (36.7 C) 98.7 F (37.1 C)  TempSrc: Oral   Oral  SpO2: 100% 93% 90%   Weight:      Height:        Wt Readings from Last 3 Encounters:  03/21/21 55.8 kg  07/31/18 55.1 kg  09/25/13 62.8 kg     Intake/Output Summary (Last 24 hours) at 03/23/2021 1257 Last data filed at 03/22/2021 1854 Gross per 24 hour  Intake 0 ml  Output 350 ml  Net -350 ml    Physical Exam  Gen:- Awake Alert,  In no apparent distress, pleasantly confused HEENT:- [place].AT, No sclera icterus Neck-Supple Neck,No JVD,.  Lungs-  CTAB , fair symmetrical air movement CV- S1, S2 normal, regular  Abd-  +ve B.Sounds, Abd Soft, No tenderness,    Extremity/Skin:- No  edema, pedal pulses present  Psych-baseline cognitive and memory deficits persist Neuro-Generalized weakness, no new focal deficits, no tremors MSK-LT LE is shortened and rotated   Data Review:   Micro Results Recent Results (from the past 240 hour(s))  Resp Panel by RT-PCR (Flu A&B, Covid) Nasopharyngeal Swab     Status: None   Collection Time: 03/21/21  7:19 PM   Specimen: Nasopharyngeal Swab; Nasopharyngeal(NP) swabs in vial transport medium  Result Value Ref Range Status   SARS Coronavirus 2 by RT PCR NEGATIVE NEGATIVE Final    Comment: (NOTE) SARS-CoV-2 target nucleic acids are NOT DETECTED.  The SARS-CoV-2 RNA is generally detectable in upper respiratory specimens during the acute phase of infection. The lowest concentration of SARS-CoV-2 viral copies this assay can detect is 138 copies/mL. A negative result does not preclude SARS-Cov-2 infection and should not be used as the sole basis for treatment or other patient management decisions. A negative result may occur with  improper specimen collection/handling, submission of specimen other than nasopharyngeal swab, presence of viral mutation(s) within the areas targeted by this assay, and inadequate number of viral copies(<138 copies/mL). A negative result must be combined  with clinical observations, patient history, and epidemiological information. The expected result is Negative.  Fact Sheet for Patients:  EntrepreneurPulse.com.au  Fact Sheet for Healthcare Providers:  IncredibleEmployment.be  This test is no t yet approved or cleared by the [place] FDA and  has been authorized for detection and/or diagnosis of SARS-CoV-2 by FDA under an Emergency Use Authorization (EUA). This EUA will remain  in effect (meaning this test can be used) for the duration of the COVID-19 declaration  under Section 564(b)(1) of the Act, 21 U.S.C.section 360bbb-3(b)(1), unless the authorization is terminated  or revoked sooner.       Influenza A by PCR NEGATIVE NEGATIVE Final   Influenza B by PCR NEGATIVE NEGATIVE Final    Comment: (NOTE) The Xpert Xpress SARS-CoV-2/FLU/RSV plus assay is intended as an aid in the diagnosis of influenza from Nasopharyngeal swab specimens and should not be used as a sole basis for treatment. Nasal washings and aspirates are unacceptable for Xpert Xpress SARS-CoV-2/FLU/RSV testing.  Fact Sheet for Patients: EntrepreneurPulse.com.au  Fact Sheet for Healthcare Providers: IncredibleEmployment.be  This test is not yet approved or cleared by the [place] FDA and has been authorized for detection and/or diagnosis of SARS-CoV-2 by FDA under an Emergency Use Authorization (EUA). This EUA will remain in effect (meaning this test can be used) for the duration of the COVID-19 declaration under Section 564(b)(1) of the Act, 21 U.S.C. section 360bbb-3(b)(1), unless the authorization is terminated or revoked.  Performed at [facility], [address], [place], [place] [address]   Surgical PCR screen     Status: None   Collection Time: 03/22/21  7:49 PM   Specimen: Nasal Mucosa; Nasal Swab  Result Value Ref Range Status   MRSA, PCR NEGATIVE NEGATIVE Final    Staphylococcus aureus NEGATIVE NEGATIVE Final    Comment: (NOTE) The Xpert SA Assay (FDA approved for NASAL specimens in patients 89 years of age and older), is one component of a comprehensive surveillance program. It is not intended to diagnose infection nor to guide or monitor treatment. Performed at [facility], [address] [place], [place] [address]    Radiology Reports DG Thoracic Spine W/Swimmers  Result Date: 03/21/2021 CLINICAL DATA:  [place] today. EXAM: THORACIC SPINE - 3 VIEWS COMPARISON:  None. FINDINGS: Age related degenerative changes and exaggerated thoracic kyphosis. No acute bony findings or destructive bony changes. No abnormal paraspinal soft tissue swelling. The visualized lungs are grossly clear. IMPRESSION: Age related degenerative changes but no acute bony findings. Electronically Signed   By: Marijo Sanes M.D.   On: 03/21/2021 16:40   DG Lumbar Spine Complete  Result Date: 03/21/2021 CLINICAL DATA:  [place]. EXAM: LUMBAR SPINE - COMPLETE 4+ VIEW COMPARISON:  None FINDINGS: There is a compression fracture of L1 of uncertain age. The other lumbar vertebral bodies are maintained. No definite pars defects. The visualized bony pelvis is intact. IMPRESSION: L1 compression fracture of uncertain age. Electronically Signed   By: Marijo Sanes M.D.   On: 03/21/2021 16:43   DG Pelvis 1-2 Views  Result Date: 03/21/2021 CLINICAL DATA:  [place].  Left hip pain. EXAM: PELVIS - 1-2 VIEW COMPARISON:  None. FINDINGS: Both hips are normally located. Impacted subcapital/high femoral neck fracture on the left. No right-sided hip fracture. The pubic symphysis and SI joints are intact. No pelvic fractures. IMPRESSION: Impacted subcapital/high femoral neck fracture on the left. Electronically Signed   By: Marijo Sanes M.D.   On: 03/21/2021 16:45   DG Chest Port 1 View  Result Date: 03/21/2021 CLINICAL DATA:  Preop evaluation for left femoral repair EXAM: PORTABLE CHEST 1 VIEW COMPARISON:   02/16/2006 FINDINGS: Cardiac shadow is within normal limits. Small hiatal hernia is noted. Aortic calcifications are seen. Chronic left proximal humeral fracture is noted. Scarring is noted in the upper lobes bilaterally without focal infiltrate. No acute bony abnormality is seen. IMPRESSION: Chronic changes without acute abnormality. Electronically Signed   By: Inez Catalina M.D.   On: 03/21/2021  19:06   DG HIP UNILAT WITH PELVIS 2-3 VIEWS LEFT  Result Date: 03/22/2021 CLINICAL DATA:  Preop planning after fall. EXAM: DG HIP (WITH OR WITHOUT PELVIS) 2-3V LEFT COMPARISON:  One day prior FINDINGS: Again identified is a subcapital femoral neck fracture with impaction and varus angulation. No dislocation. IMPRESSION: Left femoral neck fracture, as before. Electronically Signed   By: Abigail Miyamoto M.D.   On: 03/22/2021 14:29   DG FEMUR MIN 2 VIEWS LEFT  Result Date: 03/21/2021 CLINICAL DATA:  [place].  Left hip pain. EXAM: LEFT FEMUR 2 VIEWS COMPARISON:  None. FINDINGS: There is a mildly displaced and impacted subcapital/high femoral neck fracture. No fracture of the femoral shaft. The knee joint is unremarkable. IMPRESSION: Mildly displaced and impacted subcapital/high femoral neck fracture. Electronically Signed   By: Marijo Sanes M.D.   On: 03/21/2021 16:44    CBC Recent Labs  Lab 03/21/21 1624 03/22/21 0613 03/23/21 0554  WBC 15.5* 12.0* 10.2  HGB 15.1* 14.4 14.1  HCT 47.1* 45.5 43.7  PLT 197 182 165  MCV 88.9 88.3 88.8  MCH 28.5 28.0 28.7  MCHC 32.1 31.6 32.3  RDW 14.3 14.1 14.4    Chemistries  Recent Labs  Lab 03/21/21 1624  NA 135  K 3.9  CL 103  CO2 22  GLUCOSE 128*  BUN 14  CREATININE 0.74  CALCIUM 8.7*  AST 27  ALT 22  ALKPHOS 64  BILITOT 0.6   ------------------------------------------------------------------------------------------------------------------ No results for input(s): CHOL, HDL, LDLCALC, TRIG, CHOLHDL, LDLDIRECT in the last 72 hours.  No results found for:  HGBA1C ------------------------------------------------------------------------------------------------------------------ No results for input(s): TSH, T4TOTAL, T3FREE, THYROIDAB in the last 72 hours.  Invalid input(s): FREET3 ------------------------------------------------------------------------------------------------------------------ No results for input(s): VITAMINB12, FOLATE, FERRITIN, TIBC, IRON, RETICCTPCT in the last 72 hours.  Coagulation profile Recent Labs  Lab 03/21/21 1854  INR 1.2    No results for input(s): DDIMER in the last 72 hours.  Cardiac Enzymes No results for input(s): CKMB, TROPONINI, MYOGLOBIN in the last 168 hours.  Invalid input(s): CK ------------------------------------------------------------------------------------------------------------------ No results found for: BNP  Roxan Hockey M.D on 03/23/2021 at 12:57 PM  Go to [email or web address] - for contact info  [facility] - Office  [phone]

## 2021-03-23 NOTE — Transfer of Care (Signed)
Immediate Anesthesia Transfer of Care Note  Patient: Diane Wise  Procedure(s) Performed: PARTIAL LEFT HIP REPLACEMENT, BIPOLAR (Left Hip)  Patient Location: PACU  Anesthesia Type:Spinal  Level of Consciousness: drowsy  Airway & Oxygen Therapy: Patient Spontanous Breathing and Patient connected to face mask oxygen  Post-op Assessment: Report given to RN and Post -op Vital signs reviewed and stable  Post vital signs: Reviewed and stable  Last Vitals:  Vitals Value Taken Time  BP 113/95 03/23/21 1507  Temp    Pulse 96 03/23/21 1509  Resp 16 03/23/21 1509  SpO2 96 % 03/23/21 1509  Vitals shown include unvalidated device data.  Last Pain:  Vitals:   03/23/21 1159  TempSrc: Oral  PainSc: 0-No pain      Patients Stated Pain Goal:  (unable to get value from patient) (48/27/07 8675)  Complications: No complications documented.

## 2021-03-23 NOTE — Anesthesia Procedure Notes (Signed)
Spinal  Patient location during procedure: OR Start time: 03/23/2021 1:13 PM End time: 03/23/2021 1:19 PM Reason for block: surgical anesthesia Staffing Anesthesiologist: Louann Sjogren, MD Resident/CRNA: Vista Deck, CRNA Preanesthetic Checklist Completed: patient identified, IV checked, site marked, risks and benefits discussed, surgical consent, monitors and equipment checked, pre-op evaluation and timeout performed Spinal Block Patient position: left lateral decubitus Prep: Betadine Patient monitoring: heart rate, cardiac monitor, continuous pulse ox and blood pressure Approach: left paramedian Location: L3-4 Injection technique: single-shot Needle Needle type: Sprotte  Needle gauge: 22 G Needle length: 9 cm Assessment Sensory level: T8 Events: CSF return Additional Notes Betadine prep x3 1% lidocaine skinwheal  Clear CSF pre and post injection  ATTEMPTS: 3 TRAY LM:RAJH: [id number] TRAY EXPIRATION DATE: 2022-05-02 LOT: [id number] 2 attempts made with 24G  unsuccessful

## 2021-03-23 NOTE — Plan of Care (Signed)
  Problem: Education: Goal: Knowledge of General Education information will improve Description: Including pain rating scale, medication(s)/side effects and non-pharmacologic comfort measures Outcome: Progressing   Problem: Health Behavior/Discharge Planning: Goal: Ability to manage health-related needs will improve Outcome: Progressing   Problem: Clinical Measurements: Goal: Ability to maintain clinical measurements within normal limits will improve Outcome: Progressing Goal: Will remain free from infection Outcome: Progressing Goal: Diagnostic test results will improve Outcome: Progressing Goal: Cardiovascular complication will be avoided Outcome: Progressing   Problem: Activity: Goal: Risk for activity intolerance will decrease Outcome: Progressing   Problem: Nutrition: Goal: Adequate nutrition will be maintained Outcome: Progressing   Problem: Coping: Goal: Level of anxiety will decrease Outcome: Progressing   Problem: Pain Managment: Goal: General experience of comfort will improve Outcome: Progressing   Problem: Safety: Goal: Ability to remain free from injury will improve Outcome: Progressing   Problem: Skin Integrity: Goal: Risk for impaired skin integrity will decrease Outcome: Progressing

## 2021-03-23 NOTE — Anesthesia Procedure Notes (Signed)
Date/Time: 03/23/2021 1:41 PM Performed by: Orlie Dakin, CRNA Pre-anesthesia Checklist: Patient identified, Emergency Drugs available, Suction available and Patient being monitored Patient Re-evaluated:Patient Re-evaluated prior to induction Oxygen Delivery Method: Non-rebreather mask Induction Type: IV induction Placement Confirmation: positive ETCO2

## 2021-03-23 NOTE — Interval H&P Note (Signed)
History and Physical Interval Note:  03/23/2021 1:03 PM  Diane Wise  has presented today for surgery, with the diagnosis of Displaced Left Femoral Neck Fracture.  The various methods of treatment have been discussed with the patient and family. After consideration of risks, benefits and other options for treatment, the patient has consented to  Procedure(s): PARTIAL LEFT HIP REPLACEMENT, BIPOLAR (Left) as a surgical intervention.  The patient's history has been reviewed, patient examined, no change in status, stable for surgery.  I have reviewed the patient's chart and labs.  Questions were answered to the patient's satisfaction.     Arther Abbott

## 2021-03-23 NOTE — Brief Op Note (Signed)
03/21/2021 - 03/23/2021  3:04 PM  PATIENT:  Diane Wise  79 y.o. female  PRE-OPERATIVE DIAGNOSIS:  Displaced Left Femoral Neck Fracture  POST-OPERATIVE DIAGNOSIS:  Displaced Left Femoral Neck Fracture  PROCEDURE:  Procedure(s): PARTIAL LEFT HIP REPLACEMENT, BIPOLAR (Left)  SURGEON:  Surgeon(s) and Role:    Carole Civil, MD - Primary  Findings complete femoral neck fracture left hip normal acetabulum no other abnormalities  Implants  [place] press-fit stem size 4 Size 28 x 5 inner head 45 outer diameter head  Direct lateral approach  Assisted by [facility] Assisted by [place]on Mole  PHYSICIAN ASSISTANT:   Spinal anesthesia  Estimated blood loss 100 cc  No drains  Local medication exparel 30 cc  Sponge and needle counts correct   TOURNIQUET:  * No tourniquets in log *  DICTATION: .Dragon Dictation  PLAN OF CARE: Admit to inpatient   PATIENT DISPOSITION:  PACU - hemodynamically stable.   Delay start of Pharmacological VTE agent (>24hrs) due to surgical blood loss or risk of bleeding: yes  Postop plan  Weight-bear as tolerated Direct lateral hip precautions Remove staples at 2 weeks Office visit at 4 weeks DVT prevention for 30 days use aspirin 325 mg daily

## 2021-03-23 NOTE — Op Note (Signed)
03/23/2021  3:07 PM  PATIENT:  Diane Wise  79 y.o. female  PRE-OPERATIVE DIAGNOSIS:  Displaced Left Femoral Neck Fracture  POST-OPERATIVE DIAGNOSIS:  Displaced Left Femoral Neck Fracture  PROCEDURE:  Procedure(s): PARTIAL LEFT HIP REPLACEMENT, BIPOLAR (Left)  SURGEON:  Surgeon(s) and Role:    Carole Civil, MD - Primary  Findings complete femoral neck fracture left hip normal acetabulum no other abnormalities  Implants  [place] press-fit stem size 4 Size 28 x 5 inner head 45 outer diameter head  Direct lateral approach  Assisted by [facility] Assisted by [place] Mole  PHYSICIAN ASSISTANT:   Spinal anesthesia  Estimated blood loss 100 cc  No drains  Local medication exparel 30 cc  Sponge and needle counts correct   TOURNIQUET:  * No tourniquets in log *  DICTATION: .Dragon Dictation  PLAN OF CARE: Admit to inpatient   PATIENT DISPOSITION:  PACU - hemodynamically stable.   Delay start of Pharmacological VTE agent (>24hrs) due to surgical blood loss or risk of bleeding: yes  Postop plan  Weight-bear as tolerated Direct lateral hip precautions Remove staples at 2 weeks Office visit at 4 weeks DVT prevention for 30 days use aspirin 325 mg daily

## 2021-03-23 NOTE — Anesthesia Preprocedure Evaluation (Signed)
Anesthesia Evaluation  Patient identified by MRN, date of birth, ID band Patient confused    Reviewed: Allergy & Precautions, H&P , NPO status , Patient's Chart, lab work & pertinent test results, reviewed documented beta blocker date and time , Unable to perform ROS - Chart review only  History of Anesthesia Complications (+) PROLONGED EMERGENCE and history of anesthetic complications  Airway Mallampati: II  TM Distance: >3 FB Neck ROM: full    Dental no notable dental hx.    Pulmonary neg pulmonary ROS,    Pulmonary exam normal breath sounds clear to auscultation       Cardiovascular Exercise Tolerance: Good negative cardio ROS   Rhythm:regular Rate:Normal     Neuro/Psych PSYCHIATRIC DISORDERS Anxiety Depression Dementia negative neurological ROS     GI/Hepatic Neg liver ROS, GERD  Medicated,  Endo/Other  negative endocrine ROS  Renal/GU negative Renal ROS  negative genitourinary   Musculoskeletal   Abdominal   Peds  Hematology negative hematology ROS (+)   Anesthesia Other Findings   Reproductive/Obstetrics negative OB ROS                             Anesthesia Physical Anesthesia Plan  ASA: III  Anesthesia Plan: Spinal   Post-op Pain Management:    Induction:   PONV Risk Score and Plan: Propofol infusion  Airway Management Planned:   Additional Equipment:   Intra-op Plan:   Post-operative Plan:   Informed Consent: I have reviewed the patients History and Physical, chart, labs and discussed the procedure including the risks, benefits and alternatives for the proposed anesthesia with the patient or authorized representative who has indicated his/her understanding and acceptance.     Dental Advisory Given  Plan Discussed with: CRNA  Anesthesia Plan Comments:         Anesthesia Quick Evaluation

## 2021-03-23 NOTE — Brief Op Note (Signed)
03/21/2021 - 03/23/2021  3:07 PM  PATIENT:  Diane Wise  79 y.o. female  PRE-OPERATIVE DIAGNOSIS:  Displaced Left Femoral Neck Fracture  POST-OPERATIVE DIAGNOSIS:  Displaced Left Femoral Neck Fracture  PROCEDURE:  Procedure(s): PARTIAL LEFT HIP REPLACEMENT, BIPOLAR (Left)  SURGEON:  Surgeon(s) and Role:    Carole Civil, MD - Primary  Findings complete femoral neck fracture left hip normal acetabulum no other abnormalities  Implants  [place] press-fit stem size 4 Size 28 x 5 inner head 45 outer diameter head  Direct lateral approach  Assisted by [facility] Assisted by F[place]on Mole  PHYSICIAN ASSISTANT:   Spinal anesthesia  Estimated blood loss 100 cc  No drains  Local medication exparel 30 cc  Sponge and needle counts correct   TOURNIQUET:  * No tourniquets in log *  DICTATION: .Dragon Dictation  PLAN OF CARE: Admit to inpatient   PATIENT DISPOSITION:  PACU - hemodynamically stable.   Delay start of Pharmacological VTE agent (>24hrs) due to surgical blood loss or risk of bleeding: yes  Postop plan  Weight-bear as tolerated Direct lateral hip precautions Remove staples at 2 weeks Office visit at 4 weeks DVT prevention for 30 days use aspirin 325 mg daily

## 2021-03-23 NOTE — Anesthesia Postprocedure Evaluation (Signed)
Anesthesia Post Note  Patient: Diane Wise  Procedure(s) Performed: PARTIAL LEFT HIP REPLACEMENT, BIPOLAR (Left Hip)  Patient location during evaluation: Phase II Anesthesia Type: Spinal Level of consciousness: awake Pain management: pain level controlled Vital Signs Assessment: post-procedure vital signs reviewed and stable Respiratory status: spontaneous breathing and respiratory function stable Cardiovascular status: blood pressure returned to baseline and stable Postop Assessment: no headache and no apparent nausea or vomiting Anesthetic complications: no Comments: Late entry   No complications documented.   Last Vitals:  Vitals:   03/23/21 1507 03/23/21 1515  BP: (!) 113/95 (!) 133/103  Pulse: 91 (!) 45  Resp: 15 15  Temp: 36.5 C   SpO2: 98% 95%    Last Pain:  Vitals:   03/23/21 1507  TempSrc:   PainSc: [place]

## 2021-03-24 ENCOUNTER — Encounter (HOSPITAL_COMMUNITY): Payer: Self-pay | Admitting: Orthopedic Surgery

## 2021-03-24 LAB — CBC
HCT: 40.2 % (ref 36.0–46.0)
Hemoglobin: 12.9 g/dL (ref 12.0–15.0)
MCH: 28 pg (ref 26.0–34.0)
MCHC: 32.1 g/dL (ref 30.0–36.0)
MCV: 87.2 fL (ref 80.0–100.0)
Platelets: 178 10*3/uL (ref 150–400)
RBC: 4.61 MIL/uL (ref 3.87–5.11)
RDW: 14.2 % (ref 11.5–15.5)
WBC: 14.9 10*3/uL — ABNORMAL HIGH (ref 4.0–10.5)
nRBC: 0 % (ref 0.0–0.2)

## 2021-03-24 LAB — GLUCOSE, CAPILLARY
Glucose-Capillary: 114 mg/dL — ABNORMAL HIGH (ref 70–99)
Glucose-Capillary: 129 mg/dL — ABNORMAL HIGH (ref 70–99)
Glucose-Capillary: 132 mg/dL — ABNORMAL HIGH (ref 70–99)

## 2021-03-24 LAB — BASIC METABOLIC PANEL
Anion gap: 9 (ref 5–15)
BUN: 14 mg/dL (ref 8–23)
CO2: 21 mmol/L — ABNORMAL LOW (ref 22–32)
Calcium: 8.1 mg/dL — ABNORMAL LOW (ref 8.9–10.3)
Chloride: 105 mmol/L (ref 98–111)
Creatinine, Ser: 0.71 mg/dL (ref 0.44–1.00)
GFR, Estimated: >60 mL/min (ref >60)
Glucose, Bld: 122 mg/dL — ABNORMAL HIGH (ref 70–99)
Potassium: 3.5 mmol/L (ref 3.5–5.1)
Sodium: 135 mmol/L (ref 135–145)

## 2021-03-24 MED ORDER — DOCUSATE SODIUM 100 MG PO CAPS
100.0000 mg | ORAL_CAPSULE | Freq: Every day | ORAL | Status: DC
  Administered 2021-03-25: 100 mg via ORAL
  Filled 2021-03-24 (×3): qty 1

## 2021-03-24 MED ORDER — SENNOSIDES-DOCUSATE SODIUM 8.6-50 MG PO TABS
2.0000 | ORAL_TABLET | Freq: Every day | ORAL | Status: DC
  Administered 2021-03-24 – 2021-03-26 (×3): 2 via ORAL
  Filled 2021-03-24 (×3): qty 2

## 2021-03-24 NOTE — Plan of Care (Signed)
  Problem: Acute Rehab OT Goals (only OT should resolve) Goal: Pt. Will Perform Eating Flowsheets (Taken 03/24/2021 0848) Pt Will Perform Eating:  with set-up  sitting Goal: Pt. Will Perform Grooming Flowsheets (Taken 03/24/2021 0848) Pt Will Perform Grooming:  with set-up  sitting Goal: Pt. Will Perform Upper Body Dressing Flowsheets (Taken 03/24/2021 0848) Pt Will Perform Upper Body Dressing:  with min assist  sitting Goal: Pt. Will Transfer To Toilet Flowsheets (Taken 03/24/2021 0848) Pt Will Transfer to Toilet:  with mod assist  stand pivot transfer  bedside commode  ambulating  regular height toilet Goal: Pt. Will Perform Toileting-Clothing Manipulation Flowsheets (Taken 03/24/2021 0848) Pt Will Perform Toileting - Clothing Manipulation and hygiene:  with min guard assist  sitting/lateral leans  sit to/from stand

## 2021-03-24 NOTE — Plan of Care (Signed)
  Problem: Acute Rehab PT Goals(only PT should resolve) Goal: Pt Will Go Supine/Side To Sit Outcome: Progressing Flowsheets (Taken 03/24/2021 0900) Pt will go Supine/Side to Sit: with minimal assist Goal: Pt Will Go Sit To Supine/Side Outcome: Progressing Flowsheets (Taken 03/24/2021 0900) Pt will go Sit to Supine/Side: with minimal assist Goal: Patient Will Transfer Sit To/From Stand Outcome: Progressing Flowsheets (Taken 03/24/2021 0900) Patient will transfer sit to/from stand: with moderate assist Goal: Pt Will Transfer Bed To Chair/Chair To Bed Outcome: Progressing Flowsheets (Taken 03/24/2021 0900) Pt will Transfer Bed to Chair/Chair to Bed: with mod assist Goal: Pt Will Ambulate Outcome: Progressing Flowsheets (Taken 03/24/2021 0900) Pt will Ambulate:  10 feet  with moderate assist  with least restrictive assistive device   9:01 AM, 03/24/21 Mearl Latin PT, DPT Physical Therapist at [facility]

## 2021-03-24 NOTE — TOC Initial Note (Signed)
Transition of Care [facility]) - Initial/Assessment Note    Patient Details  Name: Diane Wise MRN: [id number] Date of Birth: [birth year]  [facility] CM/SW Contact:    Boneta Lucks, RN Phone Number: 03/24/2021, 3:28 PM  Clinical Narrative:     Patient admitted with closed lift hip fracture requiring surgery. Patient is from [place]. TOC spoke with Juliann Pulse they were planning to take her back with home health. They called Sarah at [facility]. Judson Roch called to ask for orders for PT/RN/OT, then called later to say they could not take the referral.  Juliann Pulse updated, she is requesting to send out to [facility]. They can not take her back without home health. Passr is pending, documents uploaded to [employer]. Insurance authorization started. Patient is medically ready. Avoidable days added.          Expected Discharge Plan: Skilled Nursing Facility Barriers to Discharge: Continued Medical Work up   Patient Goals and CMS Choice Patient states their goals for this hospitalization and ongoing recovery are:: To get rehab CMS Medicare.gov Compare Post Acute Care list provided to:: Patient Represenative (must comment) Choice offered to / list presented to : Spouse  Expected Discharge Plan and Services Expected Discharge Plan: [place] arrangements for the past 2 months: [place]         Prior Living Arrangements/Services Living arrangements for the past 2 months: [place] Lives with:: Facility Resident Patient language and need for interpreter reviewed:: Yes        Need for Family Participation in Patient Care: Yes (Comment) Care giver support system in place?: Yes (comment)   Criminal Activity/Legal Involvement Pertinent to Current Situation/Hospitalization: No - Comment as needed  Activities of Daily Living   ADL Screening (condition at time of admission) Patient's cognitive ability adequate to safely  complete daily activities?: No Is the patient deaf or have difficulty hearing?: Yes  Permission Sought/Granted    Alcohol / Substance Use: Not Applicable Psych Involvement: No (comment)  Admission diagnosis:  Fall [W19.XXXA] Closed left hip fracture ([place]) [S72.002A] Closed fracture of left hip, initial encounter [facility]) [S72.002A] Patient Active Problem List   Diagnosis Date Noted  . GERD (gastroesophageal reflux disease) 03/22/2021  . HLD (hyperlipidemia) 03/22/2021  . Anxiety and depression 03/22/2021  . Dementia without behavioral disturbance ([place]) 03/22/2021  . Leukocytosis 03/22/2021  . Closed left hip fracture ([place]) 03/21/2021  . Memory disorder 07/31/2018  . Personal history of colonic polyps 02/13/2013   PCP:  [name], MD Pharmacy:   [facility] [place] [address] Phone: [phone] Fax: [phone]     Social Determinants of Health (SDOH) Interventions    Readmission Risk Interventions Readmission Risk Prevention Plan 03/24/2021  Medication Screening Complete  Transportation Screening Complete  Some recent data might be hidden

## 2021-03-24 NOTE — Evaluation (Signed)
Occupational Therapy Evaluation Patient Details Name: Diane Wise MRN: [id number] DOB: 05/01/1941 Today's Date: 03/24/2021    History of Present Illness Pt with fall on 03/21/21 sustained left femoral neck fx, s/p arthroplasty bipolar hip on 03/23/21   Clinical Impression   Pt agreeable to OT evaluation, PT joining shortly after. Pt pleasantly confused, asking for her Dad and [place]. Easily redirected to tasks. Pt requiring consistent cuing for following directions and completing tasks initially. Mod assist for sitting up at EOB, indicating pain with movement and hesitant to continue with tasks causing pain. Pt requiring mod to total assist for ADLs, was able to maintain sitting at EOB for ~10-15 minutes, refusing to completing tasks stating "I'd rather not." Attempted to transfer to chair, pt attempting to stand 2x but then refusing to continue and repetitively asking to return to bed, unable to be redirected. Recommend SNF on discharge to improve pt safety and independence in ADLs and improve participation in functional mobility.     Follow Up Recommendations  SNF    Equipment Recommendations  None recommended by OT       Precautions / Restrictions Precautions Precautions: Fall Restrictions Weight Bearing Restrictions: Yes LLE Weight Bearing: Weight bearing as tolerated      Mobility Bed Mobility Overal bed mobility: Needs Assistance Bed Mobility: Supine to Sit;Sit to Supine     Supine to sit: Mod assist;HOB elevated Sit to supine: Max assist;+2 for physical assistance   General bed mobility comments: pt requiring cuing to use bed rails    Transfers Overall transfer level: Needs assistance Equipment used: 2 person hand held assist Transfers: Sit to/from Stand Sit to Stand: Mod assist;+2 physical assistance                  ADL either performed or assessed with clinical judgement   ADL Overall ADL's : Needs assistance/impaired Eating/Feeding: Set up;Bed  level   Grooming: Set up;Sitting Grooming Details (indicate cue type and reason): pt able to sit at EOB for ~10-15 minutes         Upper Body Dressing : Moderate assistance;Sitting   Lower Body Dressing: Total assistance;Bed level Lower Body Dressing Details (indicate cue type and reason): pt requiring total assist for donning socks, resistant to LLE mobility Toilet Transfer: Maximal assistance;+2 for physical assistance Toilet Transfer Details (indicate cue type and reason): pt able to stand with +2 hand held assist, refusing to attempt turning           General ADL Comments: Pt requiring increased assistance for ADLs due to pain and hesitancy to perform mobility     Vision Baseline Vision/History: Wears glasses Wears Glasses: At all times Patient Visual Report: No change from baseline Vision Assessment?: No apparent visual deficits            Pertinent Vitals/Pain Pain Assessment: Faces Faces Pain Scale: Hurts little more Pain Location: left hip Pain Descriptors / Indicators: Grimacing;Sore (pt stating "that kinda hurts') Pain Intervention(s): Limited activity within patient's tolerance;Monitored during session;Repositioned;Ice applied     Hand Dominance  (unsure)   Extremity/Trunk Assessment Upper Extremity Assessment Upper Extremity Assessment: Generalized weakness   Lower Extremity Assessment Lower Extremity Assessment: Defer to PT evaluation   Cervical / Trunk Assessment Cervical / Trunk Assessment: Kyphotic   Communication Communication Communication: HOH   Cognition Arousal/Alertness: Awake/alert Behavior During Therapy: WFL for tasks assessed/performed Overall Cognitive Status: History of cognitive impairments - at baseline  Home Living Family/patient expects to be discharged to:: Skilled nursing facility                                 Additional Comments: [facility] from  [facility]      Prior Functioning/Environment Level of Independence: Needs assistance  Gait / Transfers Assistance Needed: per chart review uses RW for mobility ADL's / Homemaking Assistance Needed: Suspect staff assists with all ADLs due to cognition            OT Problem List: Decreased strength;Decreased range of motion;Decreased activity tolerance;Impaired balance (sitting and/or standing);Decreased cognition;Decreased safety awareness;Decreased knowledge of use of DME or AE;Pain      OT Treatment/Interventions: Self-care/ADL training;Therapeutic exercise;DME and/or AE instruction;Therapeutic activities;Patient/family education    OT Goals(Current goals can be found in the care plan section) Acute Rehab OT Goals Patient Stated Goal: none stated OT Goal Formulation: Patient unable to participate in goal setting Time For Goal Achievement: 04/07/21 Potential to Achieve Goals: Fair  OT Frequency: Min 2X/week           Co-evaluation PT/OT/SLP Co-Evaluation/Treatment: Yes Reason for Co-Treatment: Complexity of the patient's impairments (multi-system involvement);To address functional/ADL transfers   OT goals addressed during session: ADL's and self-care         End of Session Equipment Utilized During Treatment: Gait belt;Rolling walker Nurse Communication: Mobility status  Activity Tolerance: Patient limited by pain Patient left: in bed;with call bell/phone within reach;with bed alarm set  OT Visit Diagnosis: Muscle weakness (generalized) (M62.81);Unsteadiness on feet (R26.81);Pain Pain - Right/Left: Left Pain - part of body: Hip                Time: 3267-1245 OT Time Calculation (min): 45 min Charges:  OT General Charges $OT Visit: 1 Visit OT Evaluation $OT Eval Low Complexity: [place], OTR/L  [phone] 03/24/2021, 8:42 AM

## 2021-03-24 NOTE — Evaluation (Signed)
Physical Therapy Evaluation Patient Details Name: Diane Wise MRN: [id number] DOB: 11-12-1941 Today's Date: 03/24/2021   History of Present Illness  Pt with fall on 03/21/21 sustained left femoral neck fx, s/p arthroplasty bipolar hip on 03/23/21  Clinical Impression  Patient limited for functional mobility as stated below secondary to BLE weakness, fatigue and poor standing balance.  Patient seated EOB with OT upon entrance for session. Patient requires frequent encouragement to participate throughout session and is very [place]. Patient attempts to transfer to standing with RW, cueing, and max assist but is only able to transfer to partial standing and almost immediately returns to sitting. Patient transfers to standing with HHA and mod assist x 2 and requires constant assist to remain standing. Patient given cueing for taking steps to chair with poor carry over and is limited by pain/fatigue. Patient assisted back to bed at end of session.  Patient will benefit from continued physical therapy in hospital and recommended venue below to increase strength, balance, endurance for safe ADLs and gait.      Follow Up Recommendations SNF    Equipment Recommendations  None recommended by PT    Recommendations for Other Services       Precautions / Restrictions Precautions Precautions: Fall Precaution Comments: direct lateral precautions LLE Restrictions Weight Bearing Restrictions: Yes LLE Weight Bearing: Weight bearing as tolerated      Mobility  Bed Mobility Overal bed mobility: Needs Assistance Bed Mobility: Sit to Supine     Supine to sit: Mod assist;HOB elevated Sit to supine: Max assist;+2 for physical assistance   General bed mobility comments: Patient seated EOB with OT at beginning of session, requires max assist to transition from sitting EOB to supine    Transfers Overall transfer level: Needs assistance Equipment used: Rolling walker (2 wheeled);2 person hand held  assist Transfers: Sit to/from Stand Sit to Stand: Mod assist;+2 physical assistance         General transfer comment: attempt to transfer to standing with RW and max assist but patient with poor carry over to cueing only transfers to partial standing, able to transfer to standing with HHA and mod assist x2  Ambulation/Gait                Stairs            Wheelchair Mobility    Modified Rankin (Stroke Patients Only)       Balance Overall balance assessment: Needs assistance   Sitting balance-Leahy Scale: Fair Sitting balance - Comments: fair/good seated EOB   Standing balance support: Bilateral upper extremity supported Standing balance-Leahy Scale: Poor Standing balance comment: poor/zero                             Pertinent Vitals/Pain Pain Assessment: Faces Faces Pain Scale: Hurts little more Pain Location: left hip Pain Descriptors / Indicators: Grimacing;Sore (hurts with movement) Pain Intervention(s): Limited activity within patient's tolerance;Monitored during session;Repositioned;Ice applied    Home Living Family/patient expects to be discharged to:: Skilled nursing facility                 Additional Comments: [facility]    Prior Function Level of Independence: Needs assistance   Gait / Transfers Assistance Needed: per chart review uses RW for mobility  ADL's / Homemaking Assistance Needed: Suspect staff assists with all ADLs due to cognition  Comments: Patient appears to be a poor historian  Hand Dominance   Dominant Hand:  (unsure)    Extremity/Trunk Assessment   Upper Extremity Assessment Upper Extremity Assessment: Defer to OT evaluation    Lower Extremity Assessment Lower Extremity Assessment: Generalized weakness    Cervical / Trunk Assessment Cervical / Trunk Assessment: Kyphotic  Communication   Communication: HOH  Cognition Arousal/Alertness: Awake/alert Behavior During Therapy:  WFL for tasks assessed/performed Overall Cognitive Status: History of cognitive impairments - at baseline                                        General Comments      Exercises     Assessment/Plan    PT Assessment Patient needs continued PT services  PT Problem List Decreased strength;Decreased mobility;Decreased range of motion;Decreased activity tolerance;Decreased balance;Decreased knowledge of use of DME;Decreased knowledge of precautions;Pain       PT Treatment Interventions DME instruction;Therapeutic exercise;Gait training;Balance training;Stair training;Neuromuscular re-education;Functional mobility training;Therapeutic activities    PT Goals (Current goals can be found in the Care Plan section)  Acute Rehab PT Goals Patient Stated Goal: none stated PT Goal Formulation: With patient Time For Goal Achievement: 04/07/21 Potential to Achieve Goals: Fair    Frequency Min 3X/week   Barriers to discharge        Co-evaluation PT/OT/SLP Co-Evaluation/Treatment: Yes Reason for Co-Treatment: Complexity of the patient's impairments (multi-system involvement);For patient/therapist safety;To address functional/ADL transfers PT goals addressed during session: Mobility/safety with mobility;Balance;Proper use of DME;Strengthening/ROM OT goals addressed during session: ADL's and self-care       AM-PAC PT "6 Clicks" Mobility  Outcome Measure Help needed turning from your back to your side while in a flat bed without using bedrails?: A Little Help needed moving from lying on your back to sitting on the side of a flat bed without using bedrails?: A Lot Help needed moving to and from a bed to a chair (including a wheelchair)?: A Lot Help needed standing up from a chair using your arms (e.g., wheelchair or bedside chair)?: A Lot Help needed to walk in hospital room?: A Lot Help needed climbing 3-5 steps with a railing? : Total 6 Click Score: 12    End of Session  Equipment Utilized During Treatment: Gait belt Activity Tolerance: Patient limited by pain;Patient limited by fatigue Patient left: in bed;with call bell/phone within reach;with bed alarm set Nurse Communication: Mobility status PT Visit Diagnosis: Unsteadiness on feet (R26.81);Other abnormalities of gait and mobility (R26.89);Muscle weakness (generalized) (M62.81);History of falling (Z91.81)    Time: 0350-0938 PT Time Calculation (min) (ACUTE ONLY): 13 min   Charges:   PT Evaluation $PT Eval Low Complexity: 1 Low          8:59 AM, 03/24/21 Mearl Latin PT, DPT Physical Therapist at [facility]

## 2021-03-24 NOTE — Progress Notes (Signed)
Patient Demographics:    Diane Wise, is a 80 y.o. female, DOB - 09/23/1941, [id number]  Admit date - 03/21/2021   Admitting Physician Ejiroghene Arlyce Dice, MD  Outpatient Primary MD for the patient is [name], MD  LOS - 3   Chief Complaint  Patient presents with  . Weakness        Subjective:    Diane Wise today has no fevers, no emesis,  No chest pain,   -Remains pleasantly confused -Niece Diane Wise at bedside, questions answered---  Assessment  & Plan :    Principal Problem:   Closed left hip fracture ([place]) Active Problems:   Leukocytosis   Anxiety and depression   Dementia without behavioral disturbance (HCC)   GERD (gastroesophageal reflux disease)   HLD (hyperlipidemia)   Memory disorder  Brief Summary:- 80 y.o. female with medical history significant for GERD,  dementia, Osteoporosis, HLD and depression and anxiety admitted on 03/21/2021  From [facility] after a mechanical fall and found to have left hip fracture  A/p 1)Lt Hip Fx-status post mechanical fall with left hip fracture on 03/21/2021 -Discussed with orthopedic surgeon Dr. Aline Brochure  S/p operative fixation on 03/23/2021 Orthopedic surgeon recommends- Weight-bear as tolerated Direct lateral hip precautions Remove staples at 2 weeks Office visit at 4 weeks DVT prevention for 30 days use aspirin 325 mg daily -Further management per orthopedic team  2)Leukocytosis--may be reactive secondary to hip fracture, UA without evidence of UTI-Chest X-ray without acute finding -WBC has normalized initially, WBC is back up due to recent surgery (reactive)  3)Depression and Anxiety--continue Zoloft 50 mg daily and as needed lorazepam  4)Dementia--- at baseline patient has moderate cognitive and memory deficits, PTA was on Aricept--- continue Aricept, stopped Seroquel  5)GERD--Protonix as ordered  6)HLD-continue  simvastatin  Disposition/Need for in-Hospital Stay- patient unable to be discharged at this time due to --left hip fracture requiring operative fixation*  Status is: Inpatient  Remains inpatient appropriate because:Please see above   Disposition: The patient is from: SNF ([facility])              Anticipated d/c is to: SNF                Anticipated d/c date is: 2 days              Patient currently is medically stable to d/c. Barriers: Not Clinically Stable- appropriate SNF bed  Code Status :  -  Code Status: Full Code   Family Communication:   Discussed with Husband, sister, Niece Consults  :  ortho  DVT Prophylaxis  :   - SCDs   SCDs Start: 03/23/21 1944 Place TED hose Start: 03/23/21 1944 -Postop DVT prophylaxis per orthopedic surgeon   Lab Results  Component Value Date   PLT 178 03/24/2021    Inpatient Medications  Scheduled Meds: . aspirin EC  325 mg Oral Q breakfast  . Chlorhexidine Gluconate Cloth  6 each Topical Daily  . docusate sodium  100 mg Oral BID  . donepezil  5 mg Oral QHS  . pantoprazole  40 mg Oral Daily  . sertraline  50 mg Oral Daily  . simvastatin  20 mg Oral QHS  . traMADol  50 mg Oral Q6H  Continuous Infusions:  PRN Meds:.acetaminophen, LORazepam, menthol-cetylpyridinium **OR** phenol, metoCLOPramide **OR** metoCLOPramide (REGLAN) injection, morphine injection, ondansetron **OR** ondansetron (ZOFRAN) IV, ondansetron **OR** ondansetron (ZOFRAN) IV, polyethylene glycol, senna-docusate    Anti-infectives (From admission, onward)   Start     Dose/Rate Route Frequency Ordered Stop   03/23/21 2030  ceFAZolin (ANCEF) IVPB 2g/100 mL premix        2 g 200 mL/hr over 30 Minutes Intravenous Every 6 hours 03/23/21 1943 03/24/21 0310   03/23/21 1204  ceFAZolin (ANCEF) 2-4 GM/100ML-% IVPB       Note to Pharmacy: Abbie Sons   : cabinet override      03/23/21 1204 03/23/21 2323   03/23/21 1130  ceFAZolin (ANCEF) IVPB 2g/100 mL premix        2  g 200 mL/hr over 30 Minutes Intravenous On call to O.R. 03/23/21 1125 03/23/21 1327        Objective:   Vitals:   03/23/21 2035 03/24/21 0535 03/24/21 0754 03/24/21 1423  BP: 100/68 128/80 110/84 90/74  Pulse: (!) 52 (!) 109 86 95  Resp: 19 20 16 18   Temp: 97.6 F (36.4 C) 98.6 F (37 C) 98.3 F (36.8 C) 98.4 F (36.9 C)  TempSrc:   Oral   SpO2: 91% 95% 96% 97%  Weight:      Height:        Wt Readings from Last 3 Encounters:  03/21/21 55.8 kg  07/31/18 55.1 kg  09/25/13 62.8 kg     Intake/Output Summary (Last 24 hours) at 03/24/2021 1633 Last data filed at 03/24/2021 1300 Gross per 24 hour  Intake 764.33 ml  Output 300 ml  Net 464.33 ml    Physical Exam  Gen:- Awake Alert,  In no apparent distress, pleasantly confused HEENT:- [place].AT, No sclera icterus Neck-Supple Neck,No JVD,.  Lungs-  CTAB , fair symmetrical air movement CV- S1, S2 normal, regular  Abd-  +ve B.Sounds, Abd Soft, No tenderness,    Extremity/Skin:- No  edema, pedal pulses present  Psych-baseline advanced cognitive and memory deficits persist Neuro-Generalized weakness, no new focal deficits, no tremors MSK-LT LE /hip with expected postop findings, wound is intact    Data Review:   Micro Results Recent Results (from the past 240 hour(s))  Resp Panel by RT-PCR (Flu A&B, Covid) Nasopharyngeal Swab     Status: None   Collection Time: 03/21/21  7:19 PM   Specimen: Nasopharyngeal Swab; Nasopharyngeal(NP) swabs in vial transport medium  Result Value Ref Range Status   SARS Coronavirus 2 by RT PCR NEGATIVE NEGATIVE Final    Comment: (NOTE) SARS-CoV-2 target nucleic acids are NOT DETECTED.  The SARS-CoV-2 RNA is generally detectable in upper respiratory specimens during the acute phase of infection. The lowest concentration of SARS-CoV-2 viral copies this assay can detect is 138 copies/mL. A negative result does not preclude SARS-Cov-2 infection and should not be used as the sole basis for  treatment or other patient management decisions. A negative result may occur with  improper specimen collection/handling, submission of specimen other than nasopharyngeal swab, presence of viral mutation(s) within the areas targeted by this assay, and inadequate number of viral copies(<138 copies/mL). A negative result must be combined with clinical observations, patient history, and epidemiological information. The expected result is Negative.  Fact Sheet for Patients:  EntrepreneurPulse.com.au  Fact Sheet for Healthcare Providers:  IncredibleEmployment.be  This test is no t yet approved or cleared by the [place] FDA and  has been authorized for detection and/or  diagnosis of SARS-CoV-2 by FDA under an Emergency Use Authorization (EUA). This EUA will remain  in effect (meaning this test can be used) for the duration of the COVID-19 declaration under Section 564(b)(1) of the Act, 21 U.S.C.section 360bbb-3(b)(1), unless the authorization is terminated  or revoked sooner.       Influenza A by PCR NEGATIVE NEGATIVE Final   Influenza B by PCR NEGATIVE NEGATIVE Final    Comment: (NOTE) The Xpert Xpress SARS-CoV-2/FLU/RSV plus assay is intended as an aid in the diagnosis of influenza from Nasopharyngeal swab specimens and should not be used as a sole basis for treatment. Nasal washings and aspirates are unacceptable for Xpert Xpress SARS-CoV-2/FLU/RSV testing.  Fact Sheet for Patients: EntrepreneurPulse.com.au  Fact Sheet for Healthcare Providers: IncredibleEmployment.be  This test is not yet approved or cleared by the [place] FDA and has been authorized for detection and/or diagnosis of SARS-CoV-2 by FDA under an Emergency Use Authorization (EUA). This EUA will remain in effect (meaning this test can be used) for the duration of the COVID-19 declaration under Section 564(b)(1) of the Act, 21  U.S.C. section 360bbb-3(b)(1), unless the authorization is terminated or revoked.  Performed at [facility], [address] [place], [place] [address]   Surgical PCR screen     Status: None   Collection Time: 03/22/21  7:49 PM   Specimen: Nasal Mucosa; Nasal Swab  Result Value Ref Range Status   MRSA, PCR NEGATIVE NEGATIVE Final   Staphylococcus aureus NEGATIVE NEGATIVE Final    Comment: (NOTE) The Xpert SA Assay (FDA approved for NASAL specimens in patients 28 years of age and older), is one component of a comprehensive surveillance program. It is not intended to diagnose infection nor to guide or monitor treatment. Performed at [facility], [address], [place], [place] [address]    Radiology Reports DG Thoracic Spine W/Swimmers  Result Date: 03/21/2021 CLINICAL DATA:  [place] today. EXAM: THORACIC SPINE - 3 VIEWS COMPARISON:  None. FINDINGS: Age related degenerative changes and exaggerated thoracic kyphosis. No acute bony findings or destructive bony changes. No abnormal paraspinal soft tissue swelling. The visualized lungs are grossly clear. IMPRESSION: Age related degenerative changes but no acute bony findings. Electronically Signed   By: Marijo Sanes M.D.   On: 03/21/2021 16:40   DG Lumbar Spine Complete  Result Date: 03/21/2021 CLINICAL DATA:  [place]. EXAM: LUMBAR SPINE - COMPLETE 4+ VIEW COMPARISON:  None FINDINGS: There is a compression fracture of L1 of uncertain age. The other lumbar vertebral bodies are maintained. No definite pars defects. The visualized bony pelvis is intact. IMPRESSION: L1 compression fracture of uncertain age. Electronically Signed   By: Marijo Sanes M.D.   On: 03/21/2021 16:43   DG Pelvis 1-2 Views  Result Date: 03/21/2021 CLINICAL DATA:  [place].  Left hip pain. EXAM: PELVIS - 1-2 VIEW COMPARISON:  None. FINDINGS: Both hips are normally located. Impacted subcapital/high femoral neck fracture on the left. No right-sided hip fracture. The pubic symphysis  and SI joints are intact. No pelvic fractures. IMPRESSION: Impacted subcapital/high femoral neck fracture on the left. Electronically Signed   By: Marijo Sanes M.D.   On: 03/21/2021 16:45   DG Pelvis Portable  Result Date: 03/23/2021 CLINICAL DATA:  Postop left hip replacement EXAM: PORTABLE PELVIS 1-2 VIEWS COMPARISON:  03/21/2021 FINDINGS: Changes of left hip replacement. Normal AP alignment. No hardware bony complicating feature. IMPRESSION: Left hip replacement.  No visible complicating feature. Electronically Signed   By: Rolm Baptise M.D.  On: 03/23/2021 15:40   DG Chest Port 1 View  Result Date: 03/21/2021 CLINICAL DATA:  Preop evaluation for left femoral repair EXAM: PORTABLE CHEST 1 VIEW COMPARISON:  02/16/2006 FINDINGS: Cardiac shadow is within normal limits. Small hiatal hernia is noted. Aortic calcifications are seen. Chronic left proximal humeral fracture is noted. Scarring is noted in the upper lobes bilaterally without focal infiltrate. No acute bony abnormality is seen. IMPRESSION: Chronic changes without acute abnormality. Electronically Signed   By: Inez Catalina M.D.   On: 03/21/2021 19:06   DG HIP UNILAT WITH PELVIS 2-3 VIEWS LEFT  Result Date: 03/22/2021 CLINICAL DATA:  Preop planning after fall. EXAM: DG HIP (WITH OR WITHOUT PELVIS) 2-3V LEFT COMPARISON:  One day prior FINDINGS: Again identified is a subcapital femoral neck fracture with impaction and varus angulation. No dislocation. IMPRESSION: Left femoral neck fracture, as before. Electronically Signed   By: Abigail Miyamoto M.D.   On: 03/22/2021 14:29   DG FEMUR MIN 2 VIEWS LEFT  Result Date: 03/21/2021 CLINICAL DATA:  [place].  Left hip pain. EXAM: LEFT FEMUR 2 VIEWS COMPARISON:  None. FINDINGS: There is a mildly displaced and impacted subcapital/high femoral neck fracture. No fracture of the femoral shaft. The knee joint is unremarkable. IMPRESSION: Mildly displaced and impacted subcapital/high femoral neck fracture.  Electronically Signed   By: Marijo Sanes M.D.   On: 03/21/2021 16:44    CBC Recent Labs  Lab 03/21/21 1624 03/22/21 0613 03/23/21 0554 03/24/21 0515  WBC 15.5* 12.0* 10.2 14.9*  HGB 15.1* 14.4 14.1 12.9  HCT 47.1* 45.5 43.7 40.2  PLT 197 182 165 178  MCV 88.9 88.3 88.8 87.2  MCH 28.5 28.0 28.7 28.0  MCHC 32.1 31.6 32.3 32.1  RDW 14.3 14.1 14.4 14.2    Chemistries  Recent Labs  Lab 03/21/21 1624 03/24/21 0515  NA 135 135  K 3.9 3.5  CL 103 105  CO2 22 21*  GLUCOSE 128* 122*  BUN 14 14  CREATININE 0.74 0.71  CALCIUM 8.7* 8.1*  AST 27  --   ALT 22  --   ALKPHOS 64  --   BILITOT 0.6  --    ------------------------------------------------------------------------------------------------------------------ No results for input(s): CHOL, HDL, LDLCALC, TRIG, CHOLHDL, LDLDIRECT in the last 72 hours.  No results found for: HGBA1C ------------------------------------------------------------------------------------------------------------------ No results for input(s): TSH, T4TOTAL, T3FREE, THYROIDAB in the last 72 hours.  Invalid input(s): FREET3 ------------------------------------------------------------------------------------------------------------------ No results for input(s): VITAMINB12, FOLATE, FERRITIN, TIBC, IRON, RETICCTPCT in the last 72 hours.  Coagulation profile Recent Labs  Lab 03/21/21 1854  INR 1.2    No results for input(s): DDIMER in the last 72 hours.  Cardiac Enzymes No results for input(s): CKMB, TROPONINI, MYOGLOBIN in the last 168 hours.  Invalid input(s): CK ------------------------------------------------------------------------------------------------------------------ No results found for: BNP  Roxan Hockey M.D on 03/24/2021 at 4:33 PM  Go to [email or web address] - for contact info  [facility] - Office  [phone]

## 2021-03-24 NOTE — TOC Progression Note (Signed)
30 Day Note / Dementia Note   Patient Details  Name: Diane Wise MRN: [id number] Date of Birth: 05-27-1941  To Whom it May Concern:  Please be advise that Ms. Fogal has a primary diagnosis of dementia which supersedes any psychiatric diagnosis.   Please be advised that the above name patient will require a short-term nursing home stay anticipated 30 days or less rehabilitation and strengthening. The plan is for return to ALF.

## 2021-03-24 NOTE — NC FL2 (Signed)
[place] LEVEL OF CARE SCREENING TOOL     IDENTIFICATION  Patient Name: Diane Wise Birthdate: August 20, 1941 Sex: female Admission Date (Current Location): 03/21/2021  [facility] and [place] Number:  [employer] and Address:  [place] [address] [place]      Provider Number: [id number]  Attending Physician Name and Address:  Roxan Hockey, MD  Relative Name and Phone Number:  Lajoyce Tamura - Husband   [phone]    Current Level of Care: Hospital Recommended Level of Care: [place] Prior Approval Number:    Date Approved/Denied:   PASRR Number:    Discharge Plan: SNF    Current Diagnoses: Patient Active Problem List   Diagnosis Date Noted  . GERD (gastroesophageal reflux disease) 03/22/2021  . HLD (hyperlipidemia) 03/22/2021  . Anxiety and depression 03/22/2021  . Dementia without behavioral disturbance ([place]) 03/22/2021  . Leukocytosis 03/22/2021  . Closed left hip fracture ([place]) 03/21/2021  . Memory disorder 07/31/2018  . Personal history of colonic polyps 02/13/2013    Orientation RESPIRATION BLADDER Height & Weight        Normal External catheter Weight: 55.8 kg Height:  5\' 4"  (162.6 cm)  BEHAVIORAL SYMPTOMS/MOOD NEUROLOGICAL BOWEL NUTRITION STATUS      Continent Diet (See DC summary)  AMBULATORY STATUS COMMUNICATION OF NEEDS Skin   Extensive Assist Verbally Surgical wounds (Left Hip)                       Personal Care Assistance Level of Assistance  Bathing,Feeding,Dressing Bathing Assistance: Maximum assistance Feeding assistance: Limited assistance Dressing Assistance: Maximum assistance     Functional Limitations Info  Sight,Hearing,Speech Sight Info: Adequate Hearing Info: Adequate Speech Info: Adequate    SPECIAL CARE FACTORS FREQUENCY  PT (By licensed PT)     PT Frequency: 5 times a week              Contractures Contractures Info: Not present     Additional Factors Info  Code Status,Allergies Code Status Info: Full Allergies Info: codeine, sulfa           Current Medications (03/24/2021):  This is the current hospital active medication list Current Facility-Administered Medications  Medication Dose Route Frequency Provider Last Rate Last Admin  . acetaminophen (TYLENOL) tablet 325-650 mg  325-650 mg Oral Q6H PRN Carole Civil, MD      . aspirin EC tablet 325 mg  325 mg Oral Q breakfast Carole Civil, MD   325 mg at 03/24/21 1004  . Chlorhexidine Gluconate Cloth 2 % PADS 6 each  6 each Topical Daily Roxan Hockey, MD   6 each at 03/24/21 1004  . docusate sodium (COLACE) capsule 100 mg  100 mg Oral BID Carole Civil, MD   100 mg at 03/24/21 1004  . donepezil (ARICEPT) tablet 5 mg  5 mg Oral QHS Carole Civil, MD   5 mg at 03/23/21 2248  . LORazepam (ATIVAN) tablet 0.5 mg  0.5 mg Oral Q8H PRN Carole Civil, MD   0.5 mg at 03/22/21 2100  . menthol-cetylpyridinium (CEPACOL) lozenge 3 mg  1 lozenge Oral PRN Carole Civil, MD       Or  . phenol (CHLORASEPTIC) mouth spray 1 spray  1 spray Mouth/Throat PRN Carole Civil, MD      . metoCLOPramide (REGLAN) tablet 5-10 mg  5-10 mg Oral Q8H PRN Carole Civil, MD  Or  . metoCLOPramide (REGLAN) injection 5-10 mg  5-10 mg Intravenous Q8H PRN Carole Civil, MD      . morphine 2 MG/ML injection 0.5-1 mg  0.5-1 mg Intravenous Q2H PRN Carole Civil, MD      . ondansetron [facility]) tablet 4 mg  4 mg Oral Q6H PRN Carole Civil, MD       Or  . ondansetron [facility]) injection 4 mg  4 mg Intravenous Q6H PRN Carole Civil, MD      . ondansetron [facility]) tablet 4 mg  4 mg Oral Q6H PRN Carole Civil, MD       Or  . ondansetron [facility]) injection 4 mg  4 mg Intravenous Q6H PRN Carole Civil, MD      . pantoprazole (PROTONIX) EC tablet 40 mg  40 mg Oral Daily Carole Civil, MD   40 mg at 03/24/21 1004  .  polyethylene glycol (MIRALAX / GLYCOLAX) packet 17 g  17 g Oral Daily PRN Carole Civil, MD      . senna-docusate (Senokot-S) tablet 1 tablet  1 tablet Oral QHS PRN Carole Civil, MD      . sertraline (ZOLOFT) tablet 50 mg  50 mg Oral Daily Carole Civil, MD   50 mg at 03/24/21 1004  . simvastatin (ZOCOR) tablet 20 mg  20 mg Oral QHS Carole Civil, MD   20 mg at 03/23/21 2248  . traMADol (ULTRAM) tablet 50 mg  50 mg Oral Q6H Carole Civil, MD   50 mg at 03/24/21 0543     Discharge Medications: Please see discharge summary for a list of discharge medications.  Relevant Imaging Results:  Relevant Lab Results:   Additional Information SS# [id number]  Boneta Lucks, RN

## 2021-03-24 NOTE — Care Management Important Message (Signed)
Important Message  Patient Details  Name: Diane Wise MRN: [id number] Date of Birth: 03-13-1941   Medicare Important Message Given:  Yes     Tommy Medal 03/24/2021, 4:40 PM

## 2021-03-24 NOTE — Progress Notes (Signed)
Subjective:   chart check   1 Day Post-Op Procedure(s) (LRB): PARTIAL LEFT HIP REPLACEMENT, BIPOLAR (Left)   Objective: Vital signs in last 24 hours: Temp:  [97.6 F (36.4 C)-98.7 F (37.1 C)] 98.6 F (37 C) (04/22 0535) Pulse Rate:  [52-109] 109 (04/22 0535) Resp:  [13-23] 20 (04/22 0535) BP: (100-142)/(63-103) 128/80 (04/22 0535) SpO2:  [91 %-100 %] 95 % (04/22 0535) FiO2 (%):  [21 %] 21 % (04/21 1944)  Intake/Output from previous day: 04/21 0701 - 04/22 0700 In: 1704.3 [P.O.:100; I.V.:1324.7; IV Piggyback:279.7] Out: 1000 [Urine:900; Blood:100] Intake/Output this shift: No intake/output data recorded.  Recent Labs    03/21/21 1624 03/22/21 0613 03/23/21 0554 03/24/21 0515  HGB 15.1* 14.4 14.1 12.9   Recent Labs    03/23/21 0554 03/24/21 0515  WBC 10.2 14.9*  RBC 4.92 4.61  HCT 43.7 40.2  PLT 165 178   Recent Labs    03/21/21 1624 03/24/21 0515  NA 135 135  K 3.9 3.5  CL 103 105  CO2 22 21*  BUN 14 14  CREATININE 0.74 0.71  GLUCOSE 128* 122*  CALCIUM 8.7* 8.1*   Recent Labs    03/21/21 1854  INR 1.2     Assessment/Plan: 1 Day Post-Op Procedure(s) (LRB): PARTIAL LEFT HIP REPLACEMENT, BIPOLAR (Left)  Postop plan  Weight-bear as tolerated Direct lateral hip precautions Remove staples at 2 weeks Office visit at 4 weeks DVT prevention for 30 days use aspirin 325 mg daily   Arther Abbott 03/24/2021, 7:20 AM

## 2021-03-25 LAB — GLUCOSE, CAPILLARY
Glucose-Capillary: 100 mg/dL — ABNORMAL HIGH (ref 70–99)
Glucose-Capillary: 101 mg/dL — ABNORMAL HIGH (ref 70–99)
Glucose-Capillary: 139 mg/dL — ABNORMAL HIGH (ref 70–99)
Glucose-Capillary: 159 mg/dL — ABNORMAL HIGH (ref 70–99)
Glucose-Capillary: 93 mg/dL (ref 70–99)

## 2021-03-25 NOTE — Evaluation (Signed)
Clinical/Bedside Swallow Evaluation Patient Details  Name: Diane Wise MRN: [id number] Date of Birth: 27-Jan-1941  Today's Date: 03/25/2021 Time: SLP Start Time (ACUTE ONLY): 1012 SLP Stop Time (ACUTE ONLY): 1033 SLP Time Calculation (min) (ACUTE ONLY): 21 min  Past Medical History:  Past Medical History:  Diagnosis Date  . Complication of anesthesia    SLOW TO WAKE UP  . GERD (gastroesophageal reflux disease)   . Memory disorder 07/31/2018  . Osteoporosis    Past Surgical History:  Past Surgical History:  Procedure Laterality Date  . ABDOMINAL HYSTERECTOMY  1984  . APPENDECTOMY  05/20/13  . HIP ARTHROPLASTY Left 03/23/2021   Procedure: PARTIAL LEFT HIP REPLACEMENT, BIPOLAR;  Surgeon: Carole Civil, MD;  Location: [facility];  Service: Orthopedics;  Laterality: Left;  . LAPAROSCOPIC APPENDECTOMY N/A 05/20/2013   Procedure: APPENDECTOMY LAPAROSCOPIC;  Surgeon: Leighton Ruff, MD;  Location: [facility];  Service: General;  Laterality: N/A;  . TONSILLECTOMY     HPI:  80 y.o. female with medical history significant for GERD,  dementia, Osteoporosis, HLD and depression and anxiety admitted on 03/21/2021  From [facility] after a mechanical fall and found to have left hip fracture. S/p operative fixation on 03/23/2021. BSE requested.   Assessment / Plan / Recommendation Clinical Impression  Clinical swallow evaluation completed at bedside. Pt alert, but confused. She required frequent encouragement for intake and pursed her lips frequently when presented with regular textures. She did finally take some mech soft when provided on a spoon mixed in with puree. Pt without overt signs or symptoms of aspiration, however is at risk for aspiration given advanced dementia, bed bound status, and feeder assist required. She will need assistance with intake and encouragement. Discussed with nursing staff and they reported limited intake this AM. Suggested that they have family offer po when they are  visiting. Will change to D3/mech soft and thin liquids and SLP to follow during acute stay. PO medications whole with puree or water, aspiration and reflux precautions. Above to medical care team. SLP Visit Diagnosis: Dysphagia, unspecified (R13.10)    Aspiration Risk  Mild aspiration risk    Diet Recommendation Dysphagia 3 (Mech soft)   Liquid Administration via: Cup;Straw Medication Administration: Whole meds with puree Supervision: Staff to assist with self feeding;Full supervision/cueing for compensatory strategies Compensations: Small sips/bites;Slow rate Postural Changes: Seated upright at 90 degrees;Remain upright for at least 30 minutes after po intake    Other  Recommendations Oral Care Recommendations: Oral care BID;Staff/trained caregiver to provide oral care Other Recommendations: Clarify dietary restrictions   Follow up Recommendations 24 hour supervision/assistance      Frequency and Duration min 2x/week  1 week       Prognosis Prognosis for Safe Diet Advancement: Good Barriers to Reach Goals: Cognitive deficits      Swallow Study   General Date of Onset: 03/21/21 HPI: 80 y.o. female with medical history significant for GERD,  dementia, Osteoporosis, HLD and depression and anxiety admitted on 03/21/2021  From [facility] after a mechanical fall and found to have left hip fracture. S/p operative fixation on 03/23/2021. BSE requested. Type of Study: Bedside Swallow Evaluation Previous Swallow Assessment: N/A Diet Prior to this Study: Regular;Thin liquids Temperature Spikes Noted: No Respiratory Status: Room air Behavior/Cognition: Alert;Confused;Requires cueing Oral Cavity Assessment: Within Functional Limits Oral Care Completed by SLP: No Oral Cavity - Dentition: Adequate natural dentition Vision: Functional for self-feeding Self-Feeding Abilities: Total assist;Needs assist Patient Positioning: Upright in bed Baseline Vocal Quality: Normal Volitional Cough:  Strong Volitional Swallow: Unable to elicit    Oral/Motor/Sensory Function Overall Oral Motor/Sensory Function: Within functional limits   Ice Chips Ice chips: Within functional limits Presentation: Spoon   Thin Liquid Thin Liquid: Within functional limits Presentation: Straw    Nectar Thick Nectar Thick Liquid: Not tested   Honey Thick Honey Thick Liquid: Not tested   Puree Puree: Within functional limits Presentation: Spoon   Solid     Solid: Impaired Presentation: Spoon Oral Phase Impairments:  (limited acceptance and expectorated regular textures)     Thank you,  Diane Wise, [place]  Anothy Bufano 03/25/2021,11:10 AM

## 2021-03-25 NOTE — Progress Notes (Signed)
Patient Demographics:    Diane Wise, is a 80 y.o. female, DOB - Jul 23, 1941, YNW:[id number]  Admit date - 03/21/2021   Admitting Physician Ejiroghene Arlyce Dice, MD  Outpatient Primary MD for the patient is [name], MD  LOS - 4   Chief Complaint  Patient presents with  . Weakness        Subjective:    Diane Wise today has no fevers, no emesis,  No chest pain,   -Confused, disoriented -Oral intake is challenging, speech pathology eval noted  Assessment  & Plan :    Principal Problem:   Closed left hip fracture (HCC) Active Problems:   Leukocytosis   Anxiety and depression   Dementia without behavioral disturbance (HCC)   GERD (gastroesophageal reflux disease)   HLD (hyperlipidemia)   Memory disorder  Brief Summary:- 80 y.o. female with medical history significant for GERD, Osteoporosis, HLD and depression and anxiety and advanced dementia with significant cognitive and memory deficits admitted on 03/21/2021  From [facility]down [facility] unit after a mechanical fall and found to have left hip fracture  A/p 1)Lt Hip Fx-status post mechanical fall with left hip fracture on 03/21/2021 -Discussed with orthopedic surgeon Dr. Aline Brochure  S/p operative fixation on 03/23/2021 Orthopedic surgeon recommends- Weight-bear as tolerated Direct lateral hip precautions Remove staples at 2 weeks Office visit at 4 weeks DVT prevention for 30 days use aspirin 325 mg daily -Further management per orthopedic team  2)Leukocytosis--may be reactive secondary to hip fracture, UA without evidence of UTI-Chest X-ray without acute finding -WBC has normalized initially, WBC is back up due to recent surgery (reactive)  3)Depression and Anxiety--continue Zoloft 50 mg daily and as needed lorazepam  4)Dementia--- at baseline patient has moderate cognitive and memory deficits, PTA was on  Aricept--- continue Aricept, stopped Seroquel  5)GERD--Protonix as ordered  6)HLD-continue simvastatin  7)FEN--risk of dehydration due to inconsistent oral intake, speech pathology eval appreciated -Dysphagia 3 (Mech soft)  -Family will try and be present for males to encourage patient to eat better  Disposition/Need for in-Hospital Stay- patient unable to be discharged at this time due to --left hip fracture requiring operative fixation, awaiting transfer to SNF rehab  Status is: Inpatient  Remains inpatient appropriate because:Please see above   Disposition: The patient is from: SNF ([facility])              Anticipated d/c is to: SNF                Anticipated d/c date is: 2 days              Patient currently is medically stable to d/c. Barriers: Not Clinically Stable- appropriate SNF bed  Code Status :  -  Code Status: Full Code   Family Communication:   Discussed with Husband, sister, Niece Consults  :  ortho  DVT Prophylaxis  :   - SCDs   SCDs Start: 03/23/21 1944 Place TED hose Start: 03/23/21 1944 -Postop DVT prophylaxis per orthopedic surgeon   Lab Results  Component Value Date   PLT 178 03/24/2021    Inpatient Medications  Scheduled Meds: . aspirin EC  325 mg Oral Q breakfast  . Chlorhexidine Gluconate Cloth  6 each Topical Daily  .  docusate sodium  100 mg Oral Daily  . donepezil  5 mg Oral QHS  . pantoprazole  40 mg Oral Daily  . senna-docusate  2 tablet Oral QHS  . sertraline  50 mg Oral Daily  . simvastatin  20 mg Oral QHS  . traMADol  50 mg Oral Q6H   Continuous Infusions:  PRN Meds:.acetaminophen, LORazepam, menthol-cetylpyridinium **OR** phenol, metoCLOPramide **OR** metoCLOPramide (REGLAN) injection, morphine injection, ondansetron **OR** ondansetron (ZOFRAN) IV, ondansetron **OR** ondansetron (ZOFRAN) IV    Anti-infectives (From admission, onward)   Start     Dose/Rate Route Frequency Ordered Stop   03/23/21 2030  ceFAZolin (ANCEF) IVPB  2g/100 mL premix        2 g 200 mL/hr over 30 Minutes Intravenous Every 6 hours 03/23/21 1943 03/24/21 0310   03/23/21 1204  ceFAZolin (ANCEF) 2-4 GM/100ML-% IVPB       Note to Pharmacy: Abbie Sons   : cabinet override      03/23/21 1204 03/23/21 2323   03/23/21 1130  ceFAZolin (ANCEF) IVPB 2g/100 mL premix        2 g 200 mL/hr over 30 Minutes Intravenous On call to O.R. 03/23/21 1125 03/23/21 1327        Objective:   Vitals:   03/24/21 1423 03/24/21 2045 03/25/21 0601 03/25/21 0733  BP: 90/74 106/73 115/85 120/68  Pulse: 95 93 92 89  Resp: 18 19 20 18   Temp: 98.4 F (36.9 C) 99.5 F (37.5 C) 98.6 F (37 C) 98.9 F (37.2 C)  TempSrc:  Oral  Oral  SpO2: 97% 97% 96% 96%  Weight:      Height:        Wt Readings from Last 3 Encounters:  03/21/21 55.8 kg  07/31/18 55.1 kg  09/25/13 62.8 kg     Intake/Output Summary (Last 24 hours) at 03/25/2021 1151 Last data filed at 03/25/2021 0900 Gross per 24 hour  Intake 180 ml  Output --  Net 180 ml    Physical Exam  Gen:- Awake Alert,  In no apparent distress, pleasantly confused HEENT:- [place].AT, No sclera icterus Neck-Supple Neck,No JVD,.  Lungs-  CTAB , fair symmetrical air movement CV- S1, S2 normal, regular  Abd-  +ve B.Sounds, Abd Soft, No tenderness,    Extremity/Skin:- No  edema, pedal pulses present  Psych-baseline advanced cognitive and memory deficits persist Neuro-Generalized weakness, no new focal deficits, no tremors MSK-LT LE /hip with expected postop findings, wound is intact    Data Review:   Micro Results Recent Results (from the past 240 hour(s))  Resp Panel by RT-PCR (Flu A&B, Covid) Nasopharyngeal Swab     Status: None   Collection Time: 03/21/21  7:19 PM   Specimen: Nasopharyngeal Swab; Nasopharyngeal(NP) swabs in vial transport medium  Result Value Ref Range Status   SARS Coronavirus 2 by RT PCR NEGATIVE NEGATIVE Final    Comment: (NOTE) SARS-CoV-2 target nucleic acids are NOT  DETECTED.  The SARS-CoV-2 RNA is generally detectable in upper respiratory specimens during the acute phase of infection. The lowest concentration of SARS-CoV-2 viral copies this assay can detect is 138 copies/mL. A negative result does not preclude SARS-Cov-2 infection and should not be used as the sole basis for treatment or other patient management decisions. A negative result may occur with  improper specimen collection/handling, submission of specimen other than nasopharyngeal swab, presence of viral mutation(s) within the areas targeted by this assay, and inadequate number of viral copies(<138 copies/mL). A negative result must be combined  with clinical observations, patient history, and epidemiological information. The expected result is Negative.  Fact Sheet for Patients:  EntrepreneurPulse.com.au  Fact Sheet for Healthcare Providers:  IncredibleEmployment.be  This test is no t yet approved or cleared by the [place] FDA and  has been authorized for detection and/or diagnosis of SARS-CoV-2 by FDA under an Emergency Use Authorization (EUA). This EUA will remain  in effect (meaning this test can be used) for the duration of the COVID-19 declaration under Section 564(b)(1) of the Act, 21 U.S.C.section 360bbb-3(b)(1), unless the authorization is terminated  or revoked sooner.       Influenza A by PCR NEGATIVE NEGATIVE Final   Influenza B by PCR NEGATIVE NEGATIVE Final    Comment: (NOTE) The Xpert Xpress SARS-CoV-2/FLU/RSV plus assay is intended as an aid in the diagnosis of influenza from Nasopharyngeal swab specimens and should not be used as a sole basis for treatment. Nasal washings and aspirates are unacceptable for Xpert Xpress SARS-CoV-2/FLU/RSV testing.  Fact Sheet for Patients: EntrepreneurPulse.com.au  Fact Sheet for Healthcare Providers: IncredibleEmployment.be  This test is not yet  approved or cleared by the [place] FDA and has been authorized for detection and/or diagnosis of SARS-CoV-2 by FDA under an Emergency Use Authorization (EUA). This EUA will remain in effect (meaning this test can be used) for the duration of the COVID-19 declaration under Section 564(b)(1) of the Act, 21 U.S.C. section 360bbb-3(b)(1), unless the authorization is terminated or revoked.  Performed at [facility], [address] [place], [place] [address]   Surgical PCR screen     Status: None   Collection Time: 03/22/21  7:49 PM   Specimen: Nasal Mucosa; Nasal Swab  Result Value Ref Range Status   MRSA, PCR NEGATIVE NEGATIVE Final   Staphylococcus aureus NEGATIVE NEGATIVE Final    Comment: (NOTE) The Xpert SA Assay (FDA approved for NASAL specimens in patients 73 years of age and older), is one component of a comprehensive surveillance program. It is not intended to diagnose infection nor to guide or monitor treatment. Performed at [facility], [address], [place], [place] [address]    Radiology Reports DG Thoracic Spine W/Swimmers  Result Date: 03/21/2021 CLINICAL DATA:  [place] today. EXAM: THORACIC SPINE - 3 VIEWS COMPARISON:  None. FINDINGS: Age related degenerative changes and exaggerated thoracic kyphosis. No acute bony findings or destructive bony changes. No abnormal paraspinal soft tissue swelling. The visualized lungs are grossly clear. IMPRESSION: Age related degenerative changes but no acute bony findings. Electronically Signed   By: Marijo Sanes M.D.   On: 03/21/2021 16:40   DG Lumbar Spine Complete  Result Date: 03/21/2021 CLINICAL DATA:  [place]. EXAM: LUMBAR SPINE - COMPLETE 4+ VIEW COMPARISON:  None FINDINGS: There is a compression fracture of L1 of uncertain age. The other lumbar vertebral bodies are maintained. No definite pars defects. The visualized bony pelvis is intact. IMPRESSION: L1 compression fracture of uncertain age. Electronically Signed   By: Marijo Sanes M.D.   On: 03/21/2021 16:43   DG Pelvis 1-2 Views  Result Date: 03/21/2021 CLINICAL DATA:  [place].  Left hip pain. EXAM: PELVIS - 1-2 VIEW COMPARISON:  None. FINDINGS: Both hips are normally located. Impacted subcapital/high femoral neck fracture on the left. No right-sided hip fracture. The pubic symphysis and SI joints are intact. No pelvic fractures. IMPRESSION: Impacted subcapital/high femoral neck fracture on the left. Electronically Signed   By: Marijo Sanes M.D.   On: 03/21/2021 16:45   DG Pelvis Portable  Result Date: 03/23/2021 CLINICAL DATA:  Postop left hip replacement EXAM: PORTABLE PELVIS 1-2 VIEWS COMPARISON:  03/21/2021 FINDINGS: Changes of left hip replacement. Normal AP alignment. No hardware bony complicating feature. IMPRESSION: Left hip replacement.  No visible complicating feature. Electronically Signed   By: Rolm Baptise M.D.   On: 03/23/2021 15:40   DG Chest Port 1 View  Result Date: 03/21/2021 CLINICAL DATA:  Preop evaluation for left femoral repair EXAM: PORTABLE CHEST 1 VIEW COMPARISON:  02/16/2006 FINDINGS: Cardiac shadow is within normal limits. Small hiatal hernia is noted. Aortic calcifications are seen. Chronic left proximal humeral fracture is noted. Scarring is noted in the upper lobes bilaterally without focal infiltrate. No acute bony abnormality is seen. IMPRESSION: Chronic changes without acute abnormality. Electronically Signed   By: Inez Catalina M.D.   On: 03/21/2021 19:06   DG HIP UNILAT WITH PELVIS 2-3 VIEWS LEFT  Result Date: 03/22/2021 CLINICAL DATA:  Preop planning after fall. EXAM: DG HIP (WITH OR WITHOUT PELVIS) 2-3V LEFT COMPARISON:  One day prior FINDINGS: Again identified is a subcapital femoral neck fracture with impaction and varus angulation. No dislocation. IMPRESSION: Left femoral neck fracture, as before. Electronically Signed   By: Abigail Miyamoto M.D.   On: 03/22/2021 14:29   DG FEMUR MIN 2 VIEWS LEFT  Result Date: 03/21/2021 CLINICAL  DATA:  [place].  Left hip pain. EXAM: LEFT FEMUR 2 VIEWS COMPARISON:  None. FINDINGS: There is a mildly displaced and impacted subcapital/high femoral neck fracture. No fracture of the femoral shaft. The knee joint is unremarkable. IMPRESSION: Mildly displaced and impacted subcapital/high femoral neck fracture. Electronically Signed   By: Marijo Sanes M.D.   On: 03/21/2021 16:44    CBC Recent Labs  Lab 03/21/21 1624 03/22/21 0613 03/23/21 0554 03/24/21 0515  WBC 15.5* 12.0* 10.2 14.9*  HGB 15.1* 14.4 14.1 12.9  HCT 47.1* 45.5 43.7 40.2  PLT 197 182 165 178  MCV 88.9 88.3 88.8 87.2  MCH 28.5 28.0 28.7 28.0  MCHC 32.1 31.6 32.3 32.1  RDW 14.3 14.1 14.4 14.2    Chemistries  Recent Labs  Lab 03/21/21 1624 03/24/21 0515  NA 135 135  K 3.9 3.5  CL 103 105  CO2 22 21*  GLUCOSE 128* 122*  BUN 14 14  CREATININE 0.74 0.71  CALCIUM 8.7* 8.1*  AST 27  --   ALT 22  --   ALKPHOS 64  --   BILITOT 0.6  --    ------------------------------------------------------------------------------------------------------------------ No results for input(s): CHOL, HDL, LDLCALC, TRIG, CHOLHDL, LDLDIRECT in the last 72 hours.  No results found for: HGBA1C ------------------------------------------------------------------------------------------------------------------ No results for input(s): TSH, T4TOTAL, T3FREE, THYROIDAB in the last 72 hours.  Invalid input(s): FREET3 ------------------------------------------------------------------------------------------------------------------ No results for input(s): VITAMINB12, FOLATE, FERRITIN, TIBC, IRON, RETICCTPCT in the last 72 hours.  Coagulation profile Recent Labs  Lab 03/21/21 1854  INR 1.2    No results for input(s): DDIMER in the last 72 hours.  Cardiac Enzymes No results for input(s): CKMB, TROPONINI, MYOGLOBIN in the last 168 hours.  Invalid input(s):  CK ------------------------------------------------------------------------------------------------------------------ No results found for: BNP  Roxan Hockey M.D on 03/25/2021 at 11:51 AM  Go to [email or web address] - for contact info  [facility] - Office  [phone]

## 2021-03-25 NOTE — Progress Notes (Addendum)
Patient ID: Diane Wise, female   DOB: 1940/12/09, 80 y.o.   MRN: [id number] BP 115/85 (BP Location: Right Arm)   Pulse 92   Temp 98.6 F (37 C)   Resp 20   Ht 5\' 4"  (1.626 m)   Wt 55.8 kg   SpO2 96%   BMI 21.11 kg/m   CBC Latest Ref Rng & Units 03/24/2021 03/23/2021 03/22/2021  WBC 4.0 - 10.5 K/uL 14.9(H) 10.2 12.0(H)  Hemoglobin 12.0 - 15.0 g/dL 12.9 14.1 14.4  Hematocrit 36.0 - 46.0 % 40.2 43.7 45.5  Platelets 150 - 400 K/uL 178 165 182    BMP Latest Ref Rng & Units 03/24/2021 03/21/2021 05/21/2013  Glucose 70 - 99 mg/dL 122(H) 128(H) 148(H)  BUN 8 - 23 mg/dL 14 14 15   Creatinine 0.44 - 1.00 mg/dL 0.71 0.74 0.75  Sodium 135 - 145 mmol/L 135 135 137  Potassium 3.5 - 5.1 mmol/L 3.5 3.9 4.9  Chloride 98 - 111 mmol/L 105 103 103  CO2 22 - 32 mmol/L 21(L) 22 25  Calcium 8.9 - 10.3 mg/dL 8.1(L) 8.7(L) 9.1    Patient limited for functional mobility as stated below secondary to BLE weakness, fatigue and poor standing balance.  Patient seated EOB with OT upon entrance for session. Patient requires frequent encouragement to participate throughout session and is very [place]. Patient attempts to transfer to standing with RW, cueing, and max assist but is only able to transfer to partial standing and almost immediately returns to sitting. Patient transfers to standing with HHA and mod assist x 2 and requires constant assist to remain standing. Patient given cueing for taking steps to chair with poor carry over and is limited by pain/fatigue. Patient assisted back to bed at end of session.  Patient will benefit from continued physical therapy in hospital and recommended venue below to increase strength, balance, endurance for safe ADLs and gait.   Postop plan  Weight-bear as tolerated Direct lateral hip precautions Remove staples at 2 weeks Office visit at 4 weeks DVT prevention for 30 days use aspirin 325 mg daily

## 2021-03-26 LAB — URINALYSIS, ROUTINE W REFLEX MICROSCOPIC
Bilirubin Urine: NEGATIVE
Glucose, UA: NEGATIVE mg/dL
Hgb urine dipstick: NEGATIVE
Ketones, ur: 20 mg/dL — AB
Leukocytes,Ua: NEGATIVE
Nitrite: NEGATIVE
Protein, ur: 30 mg/dL — AB
Specific Gravity, Urine: 1.026 (ref 1.005–1.030)
pH: 5 (ref 5.0–8.0)

## 2021-03-26 LAB — CBC
HCT: 37.1 % (ref 36.0–46.0)
Hemoglobin: 12 g/dL (ref 12.0–15.0)
MCH: 28.2 pg (ref 26.0–34.0)
MCHC: 32.3 g/dL (ref 30.0–36.0)
MCV: 87.3 fL (ref 80.0–100.0)
Platelets: 217 10*3/uL (ref 150–400)
RBC: 4.25 MIL/uL (ref 3.87–5.11)
RDW: 14.1 % (ref 11.5–15.5)
WBC: 10.7 10*3/uL — ABNORMAL HIGH (ref 4.0–10.5)
nRBC: 0 % (ref 0.0–0.2)

## 2021-03-26 LAB — BASIC METABOLIC PANEL
Anion gap: 9 (ref 5–15)
BUN: 17 mg/dL (ref 8–23)
CO2: 25 mmol/L (ref 22–32)
Calcium: 8.3 mg/dL — ABNORMAL LOW (ref 8.9–10.3)
Chloride: 103 mmol/L (ref 98–111)
Creatinine, Ser: 0.64 mg/dL (ref 0.44–1.00)
GFR, Estimated: >60 mL/min (ref >60)
Glucose, Bld: 119 mg/dL — ABNORMAL HIGH (ref 70–99)
Potassium: 3.7 mmol/L (ref 3.5–5.1)
Sodium: 137 mmol/L (ref 135–145)

## 2021-03-26 LAB — GLUCOSE, CAPILLARY
Glucose-Capillary: 131 mg/dL — ABNORMAL HIGH (ref 70–99)
Glucose-Capillary: 113 mg/dL — ABNORMAL HIGH (ref 70–99)
Glucose-Capillary: 153 mg/dL — ABNORMAL HIGH (ref 70–99)
Glucose-Capillary: 158 mg/dL — ABNORMAL HIGH (ref 70–99)

## 2021-03-26 MED ORDER — MIRTAZAPINE 15 MG PO TABS
7.5000 mg | ORAL_TABLET | Freq: Every day | ORAL | Status: DC
  Administered 2021-03-26: 7.5 mg via ORAL
  Filled 2021-03-26: qty 1

## 2021-03-26 MED ORDER — ENOXAPARIN SODIUM 40 MG/0.4ML ~~LOC~~ SOLN
40.0000 mg | SUBCUTANEOUS | Status: DC
  Administered 2021-03-26: 40 mg via SUBCUTANEOUS
  Filled 2021-03-26: qty 0.4

## 2021-03-26 NOTE — Progress Notes (Signed)
Subjective: 3 Days Post-Op Procedure(s) (LRB): PARTIAL LEFT HIP REPLACEMENT, BIPOLAR (Left) CONFUSED   Objective: Vital signs in last 24 hours: Temp:  [98.4 F (36.9 C)-99.8 F (37.7 C)] 98.6 F (37 C) (04/24 0553) Pulse Rate:  [91-108] 91 (04/24 0553) Resp:  [17-19] 19 (04/24 0553) BP: (111-124)/(62-87) 111/70 (04/24 0553) SpO2:  [94 %-97 %] 97 % (04/24 0553)  Intake/Output from previous day: 04/23 0701 - 04/24 0700 In: 0  Out: 450 [Urine:450] Intake/Output this shift: Total I/O In: 120 [P.O.:120] Out: -   Recent Labs    03/24/21 0515 03/26/21 0646  HGB 12.9 12.0   Recent Labs    03/24/21 0515 03/26/21 0646  WBC 14.9* 10.7*  RBC 4.61 4.25  HCT 40.2 37.1  PLT 178 217   Recent Labs    03/24/21 0515 03/26/21 0646  NA 135 137  K 3.5 3.7  CL 105 103  CO2 21* 25  BUN 14 17  CREATININE 0.71 0.64  GLUCOSE 122* 119*  CALCIUM 8.1* 8.3*   No results for input(s): LABPT, INR in the last 72 hours.    Assessment/Plan: 3 Days Post-Op Procedure(s) (LRB): PARTIAL LEFT HIP REPLACEMENT, BIPOLAR (Left) Postop plan  Weight-bear as tolerated Direct lateral hip precautions Remove staples at 2 weeks Office visit at 4 weeks DVT prevention for 30 days use aspirin 325 mg daily    Arther Abbott 03/26/2021, 10:44 AM

## 2021-03-26 NOTE — Progress Notes (Signed)
Physical Therapy Treatment Patient Details Name: Diane Wise MRN: [id number] DOB: Nov 14, 1941 Today's Date: 03/26/2021    History of Present Illness Pt with fall on 03/21/21 sustained left femoral neck fx, s/p arthroplasty bipolar hip on 03/23/21    PT Comments    Patient very apprehensive to get requiring Max verbal/tactile cueing to participate, demonstrates poor carryover for attempting to use RW, required 2 person hand held assist to stand pivot over to chair with Max assist.  Patient tolerated sitting up in chair after therapy - nursing staff aware.  Patient will benefit from continued physical therapy in hospital and recommended venue below to increase strength, balance, endurance for safe ADLs and gait.    Follow Up Recommendations  SNF;Supervision for mobility/OOB;Supervision/Assistance - 24 hour     Equipment Recommendations  None recommended by PT    Recommendations for Other Services       Precautions / Restrictions Precautions Precautions: Fall Precaution Comments: direct lateral precautions LLE Restrictions Weight Bearing Restrictions: Yes LLE Weight Bearing: Weight bearing as tolerated    Mobility  Bed Mobility Overal bed mobility: Needs Assistance Bed Mobility: Supine to Sit     Supine to sit: Mod assist;HOB elevated     General bed mobility comments: Requires Max verbal/tactile cueing to follow directions    Transfers Overall transfer level: Needs assistance Equipment used: 2 person hand held assist Transfers: Sit to/from [facility] Sit to Stand: Max assist;+2 physical assistance Stand pivot transfers: Max assist;+2 physical assistance       General transfer comment: Patient very unsteady on feet, unable to use RW due to poor carryover/confusion, required 2 person stand pivot to transfer to chair  Ambulation/Gait                 Stairs             Wheelchair Mobility    Modified Rankin (Stroke Patients  Only)       Balance Overall balance assessment: Needs assistance Sitting-balance support: Feet supported;No upper extremity supported Sitting balance-Leahy Scale: Fair Sitting balance - Comments: seated at EOB   Standing balance support: During functional activity;Bilateral upper extremity supported Standing balance-Leahy Scale: Zero Standing balance comment: poor/zero                            Cognition Arousal/Alertness: Awake/alert Behavior During Therapy: Anxious;Agitated Overall Cognitive Status: History of cognitive impairments - at baseline                                        Exercises      General Comments        Pertinent Vitals/Pain Pain Assessment: Faces Faces Pain Scale: Hurts even more Pain Location: left hip Pain Descriptors / Indicators: Grimacing;Guarding;Sore Pain Intervention(s): Limited activity within patient's tolerance;Monitored during session;Repositioned    Home Living Family/patient expects to be discharged to:: Skilled nursing facility                    Prior Function            PT Goals (current goals can now be found in the care plan section) Acute Rehab PT Goals Patient Stated Goal: none stated PT Goal Formulation: With patient Time For Goal Achievement: 04/07/21 Potential to Achieve Goals: Fair Progress towards PT goals: Progressing toward goals    Frequency  Min 3X/week      PT Plan Current plan remains appropriate    Co-evaluation              AM-PAC PT "6 Clicks" Mobility   Outcome Measure  Help needed turning from your back to your side while in a flat bed without using bedrails?: A Lot Help needed moving from lying on your back to sitting on the side of a flat bed without using bedrails?: A Lot Help needed moving to and from a bed to a chair (including a wheelchair)?: A Lot Help needed standing up from a chair using your arms (e.g., wheelchair or bedside chair)?: A  Lot Help needed to walk in hospital room?: Total Help needed climbing 3-5 steps with a railing? : Total 6 Click Score: 10    End of Session   Activity Tolerance: Patient tolerated treatment well;Patient limited by fatigue;Treatment limited secondary to agitation Patient left: in chair;with call bell/phone within reach;with chair alarm set;Other (comment) (mittons to bilateral hands)   PT Visit Diagnosis: Unsteadiness on feet (R26.81);Other abnormalities of gait and mobility (R26.89);Muscle weakness (generalized) (M62.81);History of falling (Z91.81)     Time: 1030-1051 PT Time Calculation (min) (ACUTE ONLY): 21 min  Charges:  $Therapeutic Activity: 8-22 mins                     11:12 AM, 03/26/21 Lonell Grandchild, MPT Physical Therapist with [facility] [phone] office [phone] mobile phone

## 2021-03-26 NOTE — Progress Notes (Addendum)
Patient Demographics:    Diane Wise, is a 80 y.o. female, DOB - November 10, 1941, [id number]  Admit date - 03/21/2021   Admitting Physician Ejiroghene Arlyce Dice, MD  Outpatient Primary MD for the patient is [name], MD  LOS - 5   Chief Complaint  Patient presents with  . Weakness        Subjective:    Diane Wise today has no fevers, no emesis,  No chest pain,   -At times noncompliant with cares -Up in chair with physical therapist -Still awaiting transfer to SNF rehab when bed is available  Assessment  & Plan :    Principal Problem:   Closed left hip fracture ([place]) Active Problems:   Leukocytosis   Anxiety and depression   Dementia without behavioral disturbance (HCC)   GERD (gastroesophageal reflux disease)   HLD (hyperlipidemia)   Memory disorder  Brief Summary:- 80 y.o. female with medical history significant for GERD, Osteoporosis, HLD and depression and anxiety and advanced dementia with significant cognitive and memory deficits admitted on 03/21/2021  From [facility] lockdown memory [facility] unit after a mechanical fall and found to have left hip fracture -Awaiting transfer to SNF rehab  A/p 1)Lt Hip Fx-status post mechanical fall with left hip fracture on 03/21/2021 -Discussed with orthopedic surgeon Dr. Aline Brochure  S/p operative fixation on 03/23/2021 Orthopedic surgeon recommends- Weight-bear as tolerated Direct lateral hip precautions Remove staples at 2 weeks Office visit at 4 weeks DVT prevention for 30 days use aspirin 325 mg daily -Further management per orthopedic team  2)Leukocytosis--may be reactive secondary to hip fracture, UA without evidence of UTI-Chest X-ray without acute finding -WBC has normalized initially, WBC is back up due to recent surgery (reactive)--WBC trending down again postop  3)Depression and Anxiety--continue Zoloft 50 mg daily and as  needed lorazepam  4)Dementia--- at baseline patient has moderate cognitive and memory deficits, PTA was on Aricept--- continue Aricept, stopped Seroquel  5)GERD--Protonix as ordered  6)HLD-continue simvastatin  7)FEN--risk of dehydration due to inconsistent oral intake, speech pathology eval appreciated -Dysphagia 3 (Mech soft)  -Family will try and be present for meals to encourage patient to eat better -start Remeron for appetite stimulation  8)Social/Ethics--overall prognosis is likely to be poor if oral intake does not improve significantly -Get palliative care consult for further delineation of goals of care and to help address advanced directives -Currently remains a full code  Disposition/Need for in-Hospital Stay- patient unable to be discharged at this time due to --left hip fracture requiring operative fixation,-Still awaiting transfer to SNF rehab when bed is available  Status is: Inpatient  Remains inpatient appropriate because:Please see above   Disposition: The patient is from: SNF ([facility])              Anticipated d/c is to: SNF                Anticipated d/c date is: 1 day              Patient currently is medically stable to d/c. Barriers: - appropriate SNF bed  Code Status :  -  Code Status: Full Code   Family Communication:   Discussed with Husband, sister, Niece Consults  :  ortho  DVT Prophylaxis  :   -  SCDs   SCDs Start: 03/23/21 1944 Place TED hose Start: 03/23/21 1944 -Postop DVT prophylaxis per orthopedic surgeon   Lab Results  Component Value Date   PLT 217 03/26/2021    Inpatient Medications  Scheduled Meds: . aspirin EC  325 mg Oral Q breakfast  . Chlorhexidine Gluconate Cloth  6 each Topical Daily  . docusate sodium  100 mg Oral Daily  . donepezil  5 mg Oral QHS  . pantoprazole  40 mg Oral Daily  . senna-docusate  2 tablet Oral QHS  . sertraline  50 mg Oral Daily  . simvastatin  20 mg Oral QHS  . traMADol  50 mg Oral Q6H    Continuous Infusions:  PRN Meds:.acetaminophen, LORazepam, menthol-cetylpyridinium **OR** phenol, metoCLOPramide **OR** metoCLOPramide (REGLAN) injection, morphine injection, ondansetron **OR** ondansetron (ZOFRAN) IV, ondansetron **OR** ondansetron (ZOFRAN) IV    Anti-infectives (From admission, onward)   Start     Dose/Rate Route Frequency Ordered Stop   03/23/21 2030  ceFAZolin (ANCEF) IVPB 2g/100 mL premix        2 g 200 mL/hr over 30 Minutes Intravenous Every 6 hours 03/23/21 1943 03/24/21 0310   03/23/21 1204  ceFAZolin (ANCEF) 2-4 GM/100ML-% IVPB       Note to Pharmacy: Abbie Sons   : cabinet override      03/23/21 1204 03/23/21 2323   03/23/21 1130  ceFAZolin (ANCEF) IVPB 2g/100 mL premix        2 g 200 mL/hr over 30 Minutes Intravenous On call to O.R. 03/23/21 1125 03/23/21 1327        Objective:   Vitals:   03/25/21 1451 03/25/21 2108 03/26/21 0553 03/26/21 1439  BP: 124/87 119/62 111/70 125/82  Pulse: 93 (!) 108 91 85  Resp: 17 18 19 16   Temp: 99.8 F (37.7 C) 98.4 F (36.9 C) 98.6 F (37 C) 98.6 F (37 C)  TempSrc: Oral     SpO2: 94% 97% 97% 97%  Weight:      Height:        Wt Readings from Last 3 Encounters:  03/21/21 55.8 kg  07/31/18 55.1 kg  09/25/13 62.8 kg     Intake/Output Summary (Last 24 hours) at 03/26/2021 1440 Last data filed at 03/26/2021 0900 Gross per 24 hour  Intake 120 ml  Output 450 ml  Net -330 ml    Physical Exam  Gen:- Awake Alert,  In no apparent distress,  HEENT:- [place].AT, No sclera icterus Neck-Supple Neck,No JVD,.  Lungs-  CTAB , fair symmetrical air movement CV- S1, S2 normal, regular  Abd-  +ve B.Sounds, Abd Soft, No tenderness,    Extremity/Skin:- No  edema, pedal pulses present  Psych-baseline advanced cognitive and memory deficits persist Neuro-Generalized weakness, no new focal deficits, no tremors MSK-LT LE /hip with expected postop findings, wound is intact    Data Review:   Micro Results Recent  Results (from the past 240 hour(s))  Resp Panel by RT-PCR (Flu A&B, Covid) Nasopharyngeal Swab     Status: None   Collection Time: 03/21/21  7:19 PM   Specimen: Nasopharyngeal Swab; Nasopharyngeal(NP) swabs in vial transport medium  Result Value Ref Range Status   SARS Coronavirus 2 by RT PCR NEGATIVE NEGATIVE Final    Comment: (NOTE) SARS-CoV-2 target nucleic acids are NOT DETECTED.  The SARS-CoV-2 RNA is generally detectable in upper respiratory specimens during the acute phase of infection. The lowest concentration of SARS-CoV-2 viral copies this assay can detect is 138 copies/mL. A negative result  does not preclude SARS-Cov-2 infection and should not be used as the sole basis for treatment or other patient management decisions. A negative result may occur with  improper specimen collection/handling, submission of specimen other than nasopharyngeal swab, presence of viral mutation(s) within the areas targeted by this assay, and inadequate number of viral copies(<138 copies/mL). A negative result must be combined with clinical observations, patient history, and epidemiological information. The expected result is Negative.  Fact Sheet for Patients:  EntrepreneurPulse.com.au  Fact Sheet for Healthcare Providers:  IncredibleEmployment.be  This test is no t yet approved or cleared by the [place] FDA and  has been authorized for detection and/or diagnosis of SARS-CoV-2 by FDA under an Emergency Use Authorization (EUA). This EUA will remain  in effect (meaning this test can be used) for the duration of the COVID-19 declaration under Section 564(b)(1) of the Act, 21 U.S.C.section 360bbb-3(b)(1), unless the authorization is terminated  or revoked sooner.       Influenza A by PCR NEGATIVE NEGATIVE Final   Influenza B by PCR NEGATIVE NEGATIVE Final    Comment: (NOTE) The Xpert Xpress SARS-CoV-2/FLU/RSV plus assay is intended as an aid in the  diagnosis of influenza from Nasopharyngeal swab specimens and should not be used as a sole basis for treatment. Nasal washings and aspirates are unacceptable for Xpert Xpress SARS-CoV-2/FLU/RSV testing.  Fact Sheet for Patients: EntrepreneurPulse.com.au  Fact Sheet for Healthcare Providers: IncredibleEmployment.be  This test is not yet approved or cleared by the [place] FDA and has been authorized for detection and/or diagnosis of SARS-CoV-2 by FDA under an Emergency Use Authorization (EUA). This EUA will remain in effect (meaning this test can be used) for the duration of the COVID-19 declaration under Section 564(b)(1) of the Act, 21 U.S.C. section 360bbb-3(b)(1), unless the authorization is terminated or revoked.  Performed at [facility], [address] [place], [place] [address]   Surgical PCR screen     Status: None   Collection Time: 03/22/21  7:49 PM   Specimen: Nasal Mucosa; Nasal Swab  Result Value Ref Range Status   MRSA, PCR NEGATIVE NEGATIVE Final   Staphylococcus aureus NEGATIVE NEGATIVE Final    Comment: (NOTE) The Xpert SA Assay (FDA approved for NASAL specimens in patients 64 years of age and older), is one component of a comprehensive surveillance program. It is not intended to diagnose infection nor to guide or monitor treatment. Performed at [facility], [address] [place], [place] [address]    Radiology Reports DG Thoracic Spine W/Swimmers  Result Date: 03/21/2021 CLINICAL DATA:  [place] today. EXAM: THORACIC SPINE - 3 VIEWS COMPARISON:  None. FINDINGS: Age related degenerative changes and exaggerated thoracic kyphosis. No acute bony findings or destructive bony changes. No abnormal paraspinal soft tissue swelling. The visualized lungs are grossly clear. IMPRESSION: Age related degenerative changes but no acute bony findings. Electronically Signed   By: Marijo Sanes M.D.   On: 03/21/2021 16:40   DG Lumbar Spine  Complete  Result Date: 03/21/2021 CLINICAL DATA:  [place]. EXAM: LUMBAR SPINE - COMPLETE 4+ VIEW COMPARISON:  None FINDINGS: There is a compression fracture of L1 of uncertain age. The other lumbar vertebral bodies are maintained. No definite pars defects. The visualized bony pelvis is intact. IMPRESSION: L1 compression fracture of uncertain age. Electronically Signed   By: Marijo Sanes M.D.   On: 03/21/2021 16:43   DG Pelvis 1-2 Views  Result Date: 03/21/2021 CLINICAL DATA:  [place].  Left hip pain. EXAM: PELVIS -  1-2 VIEW COMPARISON:  None. FINDINGS: Both hips are normally located. Impacted subcapital/high femoral neck fracture on the left. No right-sided hip fracture. The pubic symphysis and SI joints are intact. No pelvic fractures. IMPRESSION: Impacted subcapital/high femoral neck fracture on the left. Electronically Signed   By: Marijo Sanes M.D.   On: 03/21/2021 16:45   DG Pelvis Portable  Result Date: 03/23/2021 CLINICAL DATA:  Postop left hip replacement EXAM: PORTABLE PELVIS 1-2 VIEWS COMPARISON:  03/21/2021 FINDINGS: Changes of left hip replacement. Normal AP alignment. No hardware bony complicating feature. IMPRESSION: Left hip replacement.  No visible complicating feature. Electronically Signed   By: Rolm Baptise M.D.   On: 03/23/2021 15:40   DG Chest Port 1 View  Result Date: 03/21/2021 CLINICAL DATA:  Preop evaluation for left femoral repair EXAM: PORTABLE CHEST 1 VIEW COMPARISON:  02/16/2006 FINDINGS: Cardiac shadow is within normal limits. Small hiatal hernia is noted. Aortic calcifications are seen. Chronic left proximal humeral fracture is noted. Scarring is noted in the upper lobes bilaterally without focal infiltrate. No acute bony abnormality is seen. IMPRESSION: Chronic changes without acute abnormality. Electronically Signed   By: Inez Catalina M.D.   On: 03/21/2021 19:06   DG HIP UNILAT WITH PELVIS 2-3 VIEWS LEFT  Result Date: 03/22/2021 CLINICAL DATA:  Preop planning after  fall. EXAM: DG HIP (WITH OR WITHOUT PELVIS) 2-3V LEFT COMPARISON:  One day prior FINDINGS: Again identified is a subcapital femoral neck fracture with impaction and varus angulation. No dislocation. IMPRESSION: Left femoral neck fracture, as before. Electronically Signed   By: Abigail Miyamoto M.D.   On: 03/22/2021 14:29   DG FEMUR MIN 2 VIEWS LEFT  Result Date: 03/21/2021 CLINICAL DATA:  [place].  Left hip pain. EXAM: LEFT FEMUR 2 VIEWS COMPARISON:  None. FINDINGS: There is a mildly displaced and impacted subcapital/high femoral neck fracture. No fracture of the femoral shaft. The knee joint is unremarkable. IMPRESSION: Mildly displaced and impacted subcapital/high femoral neck fracture. Electronically Signed   By: Marijo Sanes M.D.   On: 03/21/2021 16:44    CBC Recent Labs  Lab 03/21/21 1624 03/22/21 0613 03/23/21 0554 03/24/21 0515 03/26/21 0646  WBC 15.5* 12.0* 10.2 14.9* 10.7*  HGB 15.1* 14.4 14.1 12.9 12.0  HCT 47.1* 45.5 43.7 40.2 37.1  PLT 197 182 165 178 217  MCV 88.9 88.3 88.8 87.2 87.3  MCH 28.5 28.0 28.7 28.0 28.2  MCHC 32.1 31.6 32.3 32.1 32.3  RDW 14.3 14.1 14.4 14.2 14.1    Chemistries  Recent Labs  Lab 03/21/21 1624 03/24/21 0515 03/26/21 0646  NA 135 135 137  K 3.9 3.5 3.7  CL 103 105 103  CO2 22 21* 25  GLUCOSE 128* 122* 119*  BUN 14 14 17   CREATININE 0.74 0.71 0.64  CALCIUM 8.7* 8.1* 8.3*  AST 27  --   --   ALT 22  --   --   ALKPHOS 64  --   --   BILITOT 0.6  --   --    ------------------------------------------------------------------------------------------------------------------ No results for input(s): CHOL, HDL, LDLCALC, TRIG, CHOLHDL, LDLDIRECT in the last 72 hours.  No results found for: HGBA1C ------------------------------------------------------------------------------------------------------------------ No results for input(s): TSH, T4TOTAL, T3FREE, THYROIDAB in the last 72 hours.  Invalid input(s):  FREET3 ------------------------------------------------------------------------------------------------------------------ No results for input(s): VITAMINB12, FOLATE, FERRITIN, TIBC, IRON, RETICCTPCT in the last 72 hours.  Coagulation profile Recent Labs  Lab 03/21/21 1854  INR 1.2    No results for input(s): DDIMER in the last  72 hours.  Cardiac Enzymes No results for input(s): CKMB, TROPONINI, MYOGLOBIN in the last 168 hours.  Invalid input(s): CK ------------------------------------------------------------------------------------------------------------------ No results found for: BNP  Roxan Hockey M.D on 03/26/2021 at 2:40 PM  Go to [email or web address] - for contact info  [facility] - Office  [phone]

## 2021-03-26 NOTE — Progress Notes (Addendum)
Patient calm and corporative this morning. Attempted to feed breakfast- took a 3 bites of banana, and drank about 129ml of chocolate ensure. NT and PT got patient up to chair successfully. Husband who is at nursing home himself, called and given update on his wife & care plan.

## 2021-03-27 ENCOUNTER — Encounter (HOSPITAL_COMMUNITY): Payer: Self-pay | Admitting: Internal Medicine

## 2021-03-27 DIAGNOSIS — Z515 Encounter for palliative care: Secondary | ICD-10-CM

## 2021-03-27 DIAGNOSIS — Z7189 Other specified counseling: Secondary | ICD-10-CM

## 2021-03-27 LAB — GLUCOSE, CAPILLARY
Glucose-Capillary: 101 mg/dL — ABNORMAL HIGH (ref 70–99)
Glucose-Capillary: 106 mg/dL — ABNORMAL HIGH (ref 70–99)
Glucose-Capillary: 132 mg/dL — ABNORMAL HIGH (ref 70–99)
Glucose-Capillary: 145 mg/dL — ABNORMAL HIGH (ref 70–99)
Glucose-Capillary: 96 mg/dL (ref 70–99)

## 2021-03-27 LAB — RESP PANEL BY RT-PCR (FLU A&B, COVID) ARPGX2
Influenza A by PCR: NEGATIVE
Influenza B by PCR: NEGATIVE
SARS Coronavirus 2 by RT PCR: NEGATIVE

## 2021-03-27 MED ORDER — SENNOSIDES-DOCUSATE SODIUM 8.6-50 MG PO TABS: 2.0000 | ORAL_TABLET | Freq: Every day | ORAL | 1 refills | Status: AC

## 2021-03-27 MED ORDER — MIRTAZAPINE 7.5 MG PO TABS: 7.5000 mg | ORAL_TABLET | Freq: Every day | ORAL | 3 refills | Status: AC

## 2021-03-27 MED ORDER — OMEPRAZOLE MAGNESIUM 20 MG PO TBEC: 20.0000 mg | DELAYED_RELEASE_TABLET | Freq: Every day | ORAL | 1 refills | Status: AC

## 2021-03-27 MED ORDER — LORAZEPAM 0.5 MG PO TABS: 0.5000 mg | ORAL_TABLET | Freq: Three times a day (TID) | ORAL | 0 refills | Status: AC | PRN

## 2021-03-27 MED ORDER — ASPIRIN 325 MG PO TBEC: 325.0000 mg | DELAYED_RELEASE_TABLET | Freq: Every day | ORAL | 3 refills | Status: AC

## 2021-03-27 MED ORDER — TRAMADOL HCL 50 MG PO TABS: 50.0000 mg | ORAL_TABLET | Freq: Four times a day (QID) | ORAL | 0 refills | Status: AC

## 2021-03-27 MED ORDER — ONDANSETRON HCL 4 MG PO TABS: 4.0000 mg | ORAL_TABLET | Freq: Four times a day (QID) | ORAL | 0 refills | Status: AC | PRN

## 2021-03-27 NOTE — Discharge Instructions (Signed)
1)S/p Lt Hip Surgery---Orthopedic surgeon recommends- Weight-bear as tolerated Direct lateral hip precautions Remove staples at 2 weeks Office visit at 4 weeks DVT prevention for 30 days use aspirin 325 mg daily  2)Outpatient palliative care consult with [facility] at SNF facility advised to help delineate goals of care and address advanced directives  3)Repeat CBC and BMP blood test on Friday 03/31/2021

## 2021-03-27 NOTE — Discharge Summary (Signed)
Diane Wise, is a 80 y.o. female  DOB 12-Nov-1941  MRN [id number].  Admission date:  03/21/2021  Admitting Physician  Bethena Roys, MD  Discharge Date:  03/27/2021   Primary MD  [name], MD  Recommendations for primary care physician for things to follow:   1)S/p Lt Hip Surgery---Orthopedic surgeon recommends- Weight-bear as tolerated Direct lateral hip precautions Remove staples at 2 weeks Office visit at 4 weeks DVT prevention for 30 days use aspirin 325 mg daily  2)Outpatient palliative care consult with [facility] at SNF facility advised to help delineate goals of care and address advanced directives  3)Repeat CBC and BMP blood test on Friday 03/31/2021   Admission Diagnosis  Fall [W19.XXXA] Closed left hip fracture ([place]) [S72.002A] Closed fracture of left hip, initial encounter [facility]) [S72.002A]   Discharge Diagnosis  Fall [W19.XXXA] Closed left hip fracture ([place]) [S72.002A] Closed fracture of left hip, initial encounter ([place]) [S72.002A]    Principal Problem:   Closed left hip fracture ([place]) Active Problems:   Leukocytosis   Anxiety and depression   Dementia without behavioral disturbance (HCC)   GERD (gastroesophageal reflux disease)   HLD (hyperlipidemia)   Memory disorder      Past Medical History:  Diagnosis Date  . Complication of anesthesia    SLOW TO WAKE UP  . GERD (gastroesophageal reflux disease)   . Memory disorder 07/31/2018  . Osteoporosis     Past Surgical History:  Procedure Laterality Date  . ABDOMINAL HYSTERECTOMY  1984  . APPENDECTOMY  05/20/13  . HIP ARTHROPLASTY Left 03/23/2021   Procedure: PARTIAL LEFT HIP REPLACEMENT, BIPOLAR;  Surgeon: Carole Civil, MD;  Location: [facility];  Service: Orthopedics;  Laterality: Left;  . LAPAROSCOPIC APPENDECTOMY N/A 05/20/2013   Procedure: APPENDECTOMY LAPAROSCOPIC;  Surgeon: Leighton Ruff, MD;   Location: [facility];  Service: General;  Laterality: N/A;  . TONSILLECTOMY       HPI  from the history and physical done on the day of admission:   Chief Complaint: FALL  HPI: Diane Wise is a 80 y.o. female with medical history significant for dementia, Osteoporosis.  Patient is awake and alert, but due to baseline dementia, she is unable to answer questions appropriately, she keeps asking about her spouse.  History is obtained from chart review.   Patient was brought to the ED via EMS for reports of a fall at about 1145 today, patient was walking without a walker and so she fell.  Landing and fell backwards she was initially complaining of back pain,  ED Course: Stable.  Unremarkable BMP.  WBC 15.5.  Epic x-ray shows mildly displaced and impacted subcapital/high left femoral neck fracture. EDP provider talked to Dr. Aline Brochure who will consult for surgery.  Review of Systems: Unable to assess due to baseline dementia.    Hospital Course:     Brief Summary:- 80 y.o.femalewith medical history significant for GERD, Osteoporosis, HLD and depression and anxiety and advanced dementia with significant cognitive and memory deficits admitted on 03/21/2021  From [facility] lockdown [facility] unit after a mechanical fall and found to have left hip fracture -Awaiting transfer to SNF rehab  A/p 1)Lt Hip Fx-status post mechanical fall with left hip fracture on 03/21/2021 -Discussed with orthopedic surgeon Dr. Aline Brochure  S/p operative fixation on 03/23/2021 Orthopedic surgeon recommends- Weight-bear as tolerated Direct lateral hip precautions Remove staples at 2 weeks Office visit at 4 weeks DVT prevention for 30 days use aspirin 325 mg daily -Further management per orthopedic team  2)Leukocytosis--may be reactive secondary to hip fracture, UA without evidence of UTI-Chest X-ray without acute finding -WBC has normalized initially, WBC is back up due to recent surgery  (reactive)--WBC trending down again postop  3)Depression and Anxiety--continue as needed lorazepam  4)Dementia--- at baseline patient has moderate cognitive and memory deficits, PTA was on Aricept--- continue Aricept, stopped Seroquel  5)GERD--Protonix as ordered  6)HLD-continue simvastatin  7)FEN--risk of dehydration due to inconsistent oral intake, speech pathology eval appreciated -Dysphagia 3 (Mech soft), thin liquids -Family will try and be present for meals to encourage patient to eat better -start Remeron for appetite stimulation  8)Social/Ethics--overall prognosis is poor due to very poor oral intake  -Patient is high risk for readmission due to dehydration and AKI in the setting of poor oral intake    palliative care consult appreciated-Currently remains a full code -Palliative care will continue to follow at SNF  Disposition---transfer to SNF rehab when bed is available  Disposition: The patient is from: SNF ([facility])  Anticipated d/c is to: SNF    Code Status :  -  Code Status: Full Code   Family Communication:   Discussed with Husband, sister, Niece Consults  :  ortho  Discharge Condition: Stable  Follow UP   Contact information for follow-up providers    Carole Civil, MD Follow up in 4 week(s).   Specialties: Orthopedic Surgery, Radiology Why: post op care Contact information: [address] [place] [address] [phone]            Contact information for after-discharge care    [facility] .   Service: Skilled Nursing Contact information: [address] [address] [phone]                 Diet and Activity recommendation:  As advised  Discharge Instructions    Discharge Instructions    Amb Referral to Palliative Care   Complete by: As directed    Call MD for:  difficulty breathing, headache or visual  disturbances   Complete by: As directed    Call MD for:  persistant dizziness or light-headedness   Complete by: As directed    Call MD for:  persistant nausea and vomiting   Complete by: As directed    Call MD for:  severe uncontrolled pain   Complete by: As directed    Call MD for:  temperature >100.4   Complete by: As directed    Diet - low sodium heart healthy   Complete by: As directed    Diet recommendations: Dysphagia 3 (mechanical soft);Thin liquid Liquids provided via: Cup;Straw Medication Administration: Whole meds with puree Supervision: Staff to assist with self feeding Compensations: Small sips/bites;Slow rate Postural Changes and/or Swallow Maneuvers: Seated upright 90 degrees;Upright 30-60 min after meal   Discharge instructions   Complete by: As directed    1)S/p Lt Hip Surgery---Orthopedic surgeon recommends- Weight-bear as tolerated Direct lateral hip precautions Remove staples at 2  weeks Office visit at 4 weeks DVT prevention for 30 days use aspirin 325 mg daily  2)Outpatient palliative care consult with [facility] at SNF facility advised to help delineate goals of care and address advanced directives  3)Repeat CBC and BMP blood test on Friday 03/31/2021   Discharge wound care:   Complete by: As directed    Clean dry dressing to left hip wound   Increase activity slowly   Complete by: As directed        Discharge Medications     Allergies as of 03/27/2021      Reactions   Codeine Nausea And Vomiting   Sulfa Antibiotics Nausea And Vomiting      Medication List    STOP taking these medications   Omeprazole-Sodium Bicarbonate 20-1100 MG Caps capsule Commonly known as: ZEGERID   QUEtiapine 25 MG tablet Commonly known as: SEROQUEL   sertraline 50 MG tablet Commonly known as: ZOLOFT     TAKE these medications   acetaminophen 325 MG tablet Commonly known as: TYLENOL Take 2 tablets (650 mg total) by mouth every 4 (four) hours as needed.    aspirin 325 MG EC tablet Take 1 tablet (325 mg total) by mouth daily with breakfast. Start taking on: March 28, 2021 What changed:   medication strength  how much to take  when to take this  reasons to take this   cyanocobalamin 100 MCG tablet Take 100 mcg by mouth daily.   donepezil 5 MG tablet Commonly known as: ARICEPT Take 1 tablet (5 mg total) by mouth at bedtime.   FLAX SEED OIL PO Take 1 tablet by mouth daily.   LORazepam 0.5 MG tablet Commonly known as: ATIVAN Take 1 tablet (0.5 mg total) by mouth every 8 (eight) hours as needed for anxiety, sedation or sleep. What changed:   when to take this  reasons to take this   mirtazapine 7.5 MG tablet Commonly known as: REMERON Take 1 tablet (7.5 mg total) by mouth at bedtime.   multivitamin capsule Take 1 capsule by mouth daily.   omeprazole 20 MG tablet Commonly known as: PriLOSEC OTC Take 1 tablet (20 mg total) by mouth daily.   ondansetron 4 MG tablet Commonly known as: ZOFRAN Take 1 tablet (4 mg total) by mouth every 6 (six) hours as needed for nausea.   senna-docusate 8.6-50 MG tablet Commonly known as: Senokot-S Take 2 tablets by mouth at bedtime.   simvastatin 20 MG tablet Commonly known as: ZOCOR Take 20 mg by mouth at bedtime.   sodium chloride 0.65 % nasal spray Commonly known as: OCEAN Place 1 spray into the nose daily as needed for congestion.   traMADol 50 MG tablet Commonly known as: ULTRAM Take 1 tablet (50 mg total) by mouth every 6 (six) hours.            Discharge Care Instructions  (From admission, onward)         Start     Ordered   03/27/21 0000  Discharge wound care:       Comments: Clean dry dressing to left hip wound   03/27/21 1532         Major procedures and Radiology Reports - PLEASE review detailed and final reports for all details, in brief -   DG Thoracic Spine W/Swimmers  Result Date: 03/21/2021 CLINICAL DATA:  [place] today. EXAM: THORACIC SPINE - 3  VIEWS COMPARISON:  None. FINDINGS: Age related degenerative changes and exaggerated thoracic kyphosis. No acute bony  findings or destructive bony changes. No abnormal paraspinal soft tissue swelling. The visualized lungs are grossly clear. IMPRESSION: Age related degenerative changes but no acute bony findings. Electronically Signed   By: Marijo Sanes M.D.   On: 03/21/2021 16:40   DG Lumbar Spine Complete  Result Date: 03/21/2021 CLINICAL DATA:  [place]. EXAM: LUMBAR SPINE - COMPLETE 4+ VIEW COMPARISON:  None FINDINGS: There is a compression fracture of L1 of uncertain age. The other lumbar vertebral bodies are maintained. No definite pars defects. The visualized bony pelvis is intact. IMPRESSION: L1 compression fracture of uncertain age. Electronically Signed   By: Marijo Sanes M.D.   On: 03/21/2021 16:43   DG Pelvis 1-2 Views  Result Date: 03/21/2021 CLINICAL DATA:  [place].  Left hip pain. EXAM: PELVIS - 1-2 VIEW COMPARISON:  None. FINDINGS: Both hips are normally located. Impacted subcapital/high femoral neck fracture on the left. No right-sided hip fracture. The pubic symphysis and SI joints are intact. No pelvic fractures. IMPRESSION: Impacted subcapital/high femoral neck fracture on the left. Electronically Signed   By: Marijo Sanes M.D.   On: 03/21/2021 16:45   DG Pelvis Portable  Result Date: 03/23/2021 CLINICAL DATA:  Postop left hip replacement EXAM: PORTABLE PELVIS 1-2 VIEWS COMPARISON:  03/21/2021 FINDINGS: Changes of left hip replacement. Normal AP alignment. No hardware bony complicating feature. IMPRESSION: Left hip replacement.  No visible complicating feature. Electronically Signed   By: Rolm Baptise M.D.   On: 03/23/2021 15:40   DG Chest Port 1 View  Result Date: 03/21/2021 CLINICAL DATA:  Preop evaluation for left femoral repair EXAM: PORTABLE CHEST 1 VIEW COMPARISON:  02/16/2006 FINDINGS: Cardiac shadow is within normal limits. Small hiatal hernia is noted. Aortic calcifications are  seen. Chronic left proximal humeral fracture is noted. Scarring is noted in the upper lobes bilaterally without focal infiltrate. No acute bony abnormality is seen. IMPRESSION: Chronic changes without acute abnormality. Electronically Signed   By: Inez Catalina M.D.   On: 03/21/2021 19:06   DG HIP UNILAT WITH PELVIS 2-3 VIEWS LEFT  Result Date: 03/22/2021 CLINICAL DATA:  Preop planning after fall. EXAM: DG HIP (WITH OR WITHOUT PELVIS) 2-3V LEFT COMPARISON:  One day prior FINDINGS: Again identified is a subcapital femoral neck fracture with impaction and varus angulation. No dislocation. IMPRESSION: Left femoral neck fracture, as before. Electronically Signed   By: Abigail Miyamoto M.D.   On: 03/22/2021 14:29   DG FEMUR MIN 2 VIEWS LEFT  Result Date: 03/21/2021 CLINICAL DATA:  [place].  Left hip pain. EXAM: LEFT FEMUR 2 VIEWS COMPARISON:  None. FINDINGS: There is a mildly displaced and impacted subcapital/high femoral neck fracture. No fracture of the femoral shaft. The knee joint is unremarkable. IMPRESSION: Mildly displaced and impacted subcapital/high femoral neck fracture. Electronically Signed   By: Marijo Sanes M.D.   On: 03/21/2021 16:44   Micro Results   Recent Results (from the past 240 hour(s))  Resp Panel by RT-PCR (Flu A&B, Covid) Nasopharyngeal Swab     Status: None   Collection Time: 03/21/21  7:19 PM   Specimen: Nasopharyngeal Swab; Nasopharyngeal(NP) swabs in vial transport medium  Result Value Ref Range Status   SARS Coronavirus 2 by RT PCR NEGATIVE NEGATIVE Final    Comment: (NOTE) SARS-CoV-2 target nucleic acids are NOT DETECTED.  The SARS-CoV-2 RNA is generally detectable in upper respiratory specimens during the acute phase of infection. The lowest concentration of SARS-CoV-2 viral copies this assay can detect is 138 copies/mL. A negative result does  not preclude SARS-Cov-2 infection and should not be used as the sole basis for treatment or other patient management decisions. A  negative result may occur with  improper specimen collection/handling, submission of specimen other than nasopharyngeal swab, presence of viral mutation(s) within the areas targeted by this assay, and inadequate number of viral copies(<138 copies/mL). A negative result must be combined with clinical observations, patient history, and epidemiological information. The expected result is Negative.  Fact Sheet for Patients:  EntrepreneurPulse.com.au  Fact Sheet for Healthcare Providers:  IncredibleEmployment.be  This test is no t yet approved or cleared by the [place] FDA and  has been authorized for detection and/or diagnosis of SARS-CoV-2 by FDA under an Emergency Use Authorization (EUA). This EUA will remain  in effect (meaning this test can be used) for the duration of the COVID-19 declaration under Section 564(b)(1) of the Act, 21 U.S.C.section 360bbb-3(b)(1), unless the authorization is terminated  or revoked sooner.       Influenza A by PCR NEGATIVE NEGATIVE Final   Influenza B by PCR NEGATIVE NEGATIVE Final    Comment: (NOTE) The Xpert Xpress SARS-CoV-2/FLU/RSV plus assay is intended as an aid in the diagnosis of influenza from Nasopharyngeal swab specimens and should not be used as a sole basis for treatment. Nasal washings and aspirates are unacceptable for Xpert Xpress SARS-CoV-2/FLU/RSV testing.  Fact Sheet for Patients: EntrepreneurPulse.com.au  Fact Sheet for Healthcare Providers: IncredibleEmployment.be  This test is not yet approved or cleared by the [place] FDA and has been authorized for detection and/or diagnosis of SARS-CoV-2 by FDA under an Emergency Use Authorization (EUA). This EUA will remain in effect (meaning this test can be used) for the duration of the COVID-19 declaration under Section 564(b)(1) of the Act, 21 U.S.C. section 360bbb-3(b)(1), unless the authorization  is terminated or revoked.  Performed at [facility], [address] [place], [place] [address]   Surgical PCR screen     Status: None   Collection Time: 03/22/21  7:49 PM   Specimen: Nasal Mucosa; Nasal Swab  Result Value Ref Range Status   MRSA, PCR NEGATIVE NEGATIVE Final   Staphylococcus aureus NEGATIVE NEGATIVE Final    Comment: (NOTE) The Xpert SA Assay (FDA approved for NASAL specimens in patients 1 years of age and older), is one component of a comprehensive surveillance program. It is not intended to diagnose infection nor to guide or monitor treatment. Performed at [facility], [address] [place], [place] [address]        Today   Subjective    Diane Wise today has no new concerns  No fever  Or chills   No Nausea, Vomiting or Diarrhea - Patient's nephew Diane Wise--- is at bedside -Oral intake remains poor       -Family will try and be present for meals to encourage patient to eat better -overall prognosis is poor due to very poor oral intake  -Patient is high risk for readmission due to dehydration and AKI in the setting of poor oral intake   Patient has been seen and examined prior to discharge   Objective   Blood pressure (!) 156/85, pulse 81, temperature 99.1 F (37.3 C), temperature source Axillary, resp. rate 16, height 5\' 4"  (1.626 m), weight 55.8 kg, SpO2 94 %.   Intake/Output Summary (Last 24 hours) at 03/27/2021 1547 Last data filed at 03/27/2021 0647 Gross per 24 hour  Intake 120 ml  Output 500 ml  Net -380 ml    Exam  Gen:- Awake Alert,  In no apparent distress,  HEENT:- [place].AT, No sclera icterus Neck-Supple Neck,No JVD,.  Lungs-  CTAB , fair symmetrical air movement CV- S1, S2 normal, regular  Abd-  +ve B.Sounds, Abd Soft, No tenderness,    Extremity/Skin:- No  edema, pedal pulses present  Psych-baseline advanced cognitive and memory deficits persist Neuro-Generalized weakness, no new focal deficits, no tremors MSK-LT LE  /hip with expected postop findings, wound is intact    Data Review   CBC w Diff:  Lab Results  Component Value Date   WBC 10.7 (H) 03/26/2021   HGB 12.0 03/26/2021   HCT 37.1 03/26/2021   PLT 217 03/26/2021    CMP:  Lab Results  Component Value Date   NA 137 03/26/2021   K 3.7 03/26/2021   CL 103 03/26/2021   CO2 25 03/26/2021   BUN 17 03/26/2021   CREATININE 0.64 03/26/2021   PROT 7.0 03/21/2021   ALBUMIN 3.9 03/21/2021   BILITOT 0.6 03/21/2021   ALKPHOS 64 03/21/2021   AST 27 03/21/2021   ALT 22 03/21/2021  .   Total Discharge time is about 33 minutes  Roxan Hockey M.D on 03/27/2021 at 3:47 PM  Go to [email or web address] -  for contact info  [facility] - Office  [phone]

## 2021-03-27 NOTE — Progress Notes (Signed)
OT Cancellation Note  Patient Details Name: Diane Wise MRN: [id number] DOB: [birth year]   Cancelled Treatment:    Reason Eval/Treat Not Completed: Patient declined, no reason specified. Pt refused therapy this date reporting, "I don't feel like it." When discussing moving to the chair to sitting up in bed. Multiple attempts with pt refusal each time. Will attempt to see pt later as time permits.   Citlalli Weikel OT, MOT   Larey Seat 03/27/2021, 1:01 PM

## 2021-03-27 NOTE — Progress Notes (Signed)
Received referral [place] consult from patient nurse today. Arrived to find Diane Wise asleep and hard to rouse. Chaplin provided spiritual presence and quiet prayer bedside. No family present bedside but Chaplain will remain available in order to provide spiritual support and to assess for spiritual need.

## 2021-03-27 NOTE — Care Management Important Message (Signed)
Important Message  Patient Details  Name: Diane Wise MRN: [id number] Date of Birth: 1941-04-04   Medicare Important Message Given:  Yes     Tommy Medal 03/27/2021, 12:23 PM

## 2021-03-27 NOTE — Progress Notes (Signed)
Attempted to call report several times to [facility] [place]. No one answered to give report to. This nurse asked social worker to place patient on EMS schedule. Will attempt to call report after several minutes.

## 2021-03-27 NOTE — TOC Transition Note (Signed)
Transition of Care [facility]) - CM/SW Discharge Note   Patient Details  Name: Diane Wise MRN: [id number] Date of Birth: December 27, 1940  [facility]) CM/SW Contact:  Shade Flood, LCSW Phone Number: 03/27/2021, 4:04 PM   Clinical Narrative:     Pt stable for dc to [facility] today. Pt's husband updated by primary RNCM, Wells Guiles, and he remains in agreement with the dc plan. DC clinical sent electronically. RN to call report. Pt will transport via EMS when she is ready for dc.   No other TOC needs identified at this time.  Final next level of care: Skilled Nursing Facility Barriers to Discharge: Barriers Resolved   Patient Goals and CMS Choice Patient states their goals for this hospitalization and ongoing recovery are:: To get rehab CMS Medicare.gov Compare Post Acute Care list provided to:: Patient Represenative (must comment) Choice offered to / list presented to : Spouse  Discharge Placement              Patient [facility] bed at: [facility] Patient to be transferred to facility by: EMS Name of family member notified: [name] Patient and family notified of of transfer: 03/27/21  Discharge Plan and Services                                     Social Determinants of Health ([place]) Interventions     Readmission Risk Interventions Readmission Risk Prevention Plan 03/27/2021 03/24/2021  Medication Screening - Complete  Transportation Screening - Complete  PCP or Specialist Appt within 5-7 Days Complete -  Home Care Screening Complete -  Medication Review (RN CM) Complete -  Some recent data might be hidden

## 2021-03-27 NOTE — Consult Note (Signed)
Consultation Note Date: 03/27/2021   Patient Name: Diane Wise  DOB: [birth year]  MRN: [id number]  Age / Sex: 80 y.o., female  PCP: [name], MD Referring Physician: Roxan Hockey, MD  Reason for Consultation: Establishing goals of care and Psychosocial/spiritual support  HPI/Patient Profile: 80 y.o. female  with past medical history of dementia with significant cognitive and memory deficits, osteoporosis, HLD, depression/anxiety, GERD, appendectomy, total hysterectomy, tonsillectomy, lives in ALF, [facility], with her spouse admitted on 03/21/2021 with partial left hip replacement.   Clinical Assessment and Goals of Care: I have reviewed medical records including EPIC notes, labs and imaging, received report from transition of care team at bedside nursing, examined the patient.  Diane Wise is lying quietly in bed.  She appears acutely/chronically ill and very frail and thin.  She will initially briefly make but not keep eye contact.  She is able to tell me her name only, I do not believe that she can make her basic needs known.  There is no family at bedside at this time.  Nursing staff states that she is having difficulty with food and water intake.  As I am visiting she declines food or drink.  I return later with ice cream.  Initially she is reluctant, but after 2 bites she readily takes the ice cream and small bites, eating the entire container.  Conference with transition of care team who shares that Diane Wise sister had been making choices for her, but states due to her own advanced age and illnesses, she defers decision-making to Diane Wise. Call to [name]  to discuss diagnosis prognosis, GOC, EOL wishes, disposition and options.   I introduced Palliative Medicine as specialized medical care for people living with serious illness. It focuses on providing relief from the  symptoms and stress of a serious illness.   We discussed a brief life review of the patient.  Mr. and Diane Wise have been married since 1964.  They share no children, but [name] had a son from a previous marriage.  This son died about 2 years ago.  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  Diane Wise has a sister left, but she is also an ALF, unable to assist him.  Diane Wise has only 1 sibling left, but she also is advancing in age with poor health.  As far as functional and nutritional status, Mr. and [name] moved into [facility] about 1 year ago.  Diane Wise has since been transferred to memory care.  Diane Wise shares that his wife's appetite has been good, that she is "eating all the time", and he has seen no weight loss.  Mr. Mariea Wise sister tells me that they were in their own home and had a help, but the person who was helping them injured their shoulder and could no longer care for them.  [name] shares that Diane Wise has experienced several falls while at [facility].  I shared that I anticipate continued falls.  We discussed her current illness and what it means in the larger context of her on-going co-morbidities.  Natural disease trajectory and expectations at EOL were discussed.  We talked about Diane Wise's hip fracture, surgical repair, in conjunction with her memory loss.  I share that she has been having a poor appetite while here at the hospital, but has been eating some ice cream for me this morning.  I share that Diane Wise has been through a lot, and Diane Wise shares that she has been going through a lot while at [facility] also.    I attempted to elicit values and goals of care important to the patient.  I attempt to talk about serious issues and concerns, but Diane Wise states that he cannot have such discussions at this time.   I attempted to speak about advanced directives, concepts specific to code status, and rehospitalization, but  again, Diane Wise declines to have conversations other than to endorse full scope/full code, rehospitalize as necessary.    Palliative Care services outpatient were explained and offered, I shared that this service would be available to talk about "what if's and maybes".  [name] agreeable to outpatient palliative services.   Questions and concerns were addressed.  The family was encouraged to call with questions or concerns.   Conference with attending, bedside nursing staff, transition of care team related to patient condition, needs, goals of care.   HCPOA    NEXT OF KIN - Spouse, Diane Wise Niece is financial POA is niece.     SUMMARY OF RECOMMENDATIONS   Continue full scope/full code Continue CODE STATUS discussions Short-term rehab with goal of returning to [facility] memory care Outpatient palliative services to follow.   Code Status/Advance Care Planning:  Full code  Symptom Management:   Per hospitalist/Ortho, no additional needs at this time.  Palliative Prophylaxis:   Frequent Pain Assessment, Oral Care and Palliative Wound Care  Additional Recommendations (Limitations, Scope, Preferences):  Full Scope Treatment  Psycho-social/Spiritual:   Desire for further Chaplaincy support:yes  Additional Recommendations: Caregiving  Support/Resources and Education on Hospice  Prognosis:   < 6 months  Discharge Planning: Short-term rehab at [facility] with outpatient palliative services to follow.      Primary Diagnoses: Present on Admission: . Closed left hip fracture ([place]) . Memory disorder . GERD (gastroesophageal reflux disease) . HLD (hyperlipidemia) . Anxiety and depression . Dementia without behavioral disturbance ([place]) . Leukocytosis   I have reviewed the medical record, interviewed the patient and family, and examined the patient. The following aspects are pertinent.  Past Medical History:  Diagnosis Date  . Complication of  anesthesia    SLOW TO WAKE UP  . GERD (gastroesophageal reflux disease)   . Memory disorder 07/31/2018  . Osteoporosis    Social History   Socioeconomic History  . Marital status: Married    Spouse name: Not on file  . Number of children: 0  . Years of education: 108  . Highest education level: Not on file  Occupational History  . Not on file  Tobacco Use  . Smoking status: Never Smoker  . Smokeless tobacco: Never Used  Vaping Use  . Vaping Use: Never used  Substance and Sexual Activity  . Alcohol use: No  . Drug use: No  . Sexual activity: Not on file  Other Topics Concern  . Not on file  Social History Narrative   Lives w/ Wise   Caffeine use: some coffee and tea  Right handed    Social Determinants of Health   Financial Resource Strain: Not on file  Food Insecurity: Not on file  Transportation Needs: Not on file  Physical Activity: Not on file  Stress: Not on file  Social Connections: Not on file   Family History  Problem Relation Age of Onset  . Cancer Father   . Diabetes Sister   . Diabetes Brother    Scheduled Meds: . aspirin EC  325 mg Oral Q breakfast  . Chlorhexidine Gluconate Cloth  6 each Topical Daily  . docusate sodium  100 mg Oral Daily  . donepezil  5 mg Oral QHS  . enoxaparin (LOVENOX) injection  40 mg Subcutaneous Q24H  . mirtazapine  7.5 mg Oral QHS  . pantoprazole  40 mg Oral Daily  . senna-docusate  2 tablet Oral QHS  . sertraline  50 mg Oral Daily  . simvastatin  20 mg Oral QHS  . traMADol  50 mg Oral Q6H   Continuous Infusions: PRN Meds:.acetaminophen, LORazepam, menthol-cetylpyridinium **OR** phenol, metoCLOPramide **OR** metoCLOPramide (REGLAN) injection, morphine injection, ondansetron **OR** ondansetron (ZOFRAN) IV, ondansetron **OR** ondansetron (ZOFRAN) IV Medications Prior to Admission:  Prior to Admission medications   Medication Sig Start Date End Date Taking? Authorizing Provider  acetaminophen (TYLENOL) 325 MG tablet  Take 2 tablets (650 mg total) by mouth every 4 (four) hours as needed. 9/93/71  Yes Leighton Ruff, MD  LORazepam (ATIVAN) 0.5 MG tablet Take 0.5 mg by mouth daily as needed. 03/07/21  Yes [provider]  QUEtiapine (SEROQUEL) 25 MG tablet Take 25 mg by mouth at bedtime as needed (anxiety). 02/13/21  Yes [provider]  sertraline (ZOLOFT) 50 MG tablet Take 1 tablet by mouth daily. 03/07/21  Yes [provider]  aspirin EC 81 MG tablet Take 81 mg by mouth every 6 (six) hours as needed for pain.    [provider]  cyanocobalamin 100 MCG tablet Take 100 mcg by mouth daily.    [provider]  donepezil (ARICEPT) 5 MG tablet Take 1 tablet (5 mg total) by mouth at bedtime. Patient not taking: Reported on 03/21/2021 07/31/18   Kathrynn Ducking, MD  Flaxseed, Linseed, (FLAX SEED OIL PO) Take 1 tablet by mouth daily.     [provider]  Multiple Vitamin (MULTIVITAMIN) capsule Take 1 capsule by mouth daily.      [provider]  Omeprazole-Sodium Bicarbonate (ZEGERID) 20-1100 MG CAPS Take 1 capsule by mouth daily before breakfast. Patient not taking: Reported on 03/21/2021    [provider]  simvastatin (ZOCOR) 20 MG tablet Take 20 mg by mouth at bedtime.   Patient not taking: Reported on 03/21/2021    [provider]  sodium chloride (OCEAN) 0.65 % nasal spray Place 1 spray into the nose daily as needed for congestion. Patient not taking: No sig reported    [provider]   Allergies  Allergen Reactions  . Codeine Nausea And Vomiting  . Sulfa Antibiotics Nausea And Vomiting   Review of Systems  Unable to perform ROS: Dementia    Physical Exam Vitals and nursing note reviewed.  Constitutional:      General: She is not in acute distress.    Appearance: She is ill-appearing.  Cardiovascular:     Rate and Rhythm: Normal rate.  Pulmonary:     Effort: Pulmonary effort is normal. No respiratory distress.   Skin:    General: Skin is warm and dry.  Neurological:  Mental Status: She is alert.     Comments: Known memory loss  Psychiatric:     Comments: Calm and cooperative     Vital Signs: BP 134/71 (BP Location: Right Arm)   Pulse 81   Temp (!) 97.1 F (36.2 C)   Resp 18   Ht 5\' 4"  (1.626 m)   Wt 55.8 kg   SpO2 93%   BMI 21.11 kg/m  Pain Scale: PAINAD   Pain Score: 0-No pain   SpO2: SpO2: 93 % O2 Device:SpO2: 93 % O2 Flow Rate: .O2 Flow Rate (L/min): 0 L/min  IO: Intake/output summary:   Intake/Output Summary (Last 24 hours) at 03/27/2021 1321 Last data filed at 03/27/2021 8828 Gross per 24 hour  Intake 120 ml  Output 500 ml  Net -380 ml    LBM:   Baseline Weight: Weight: 55.8 kg Most recent weight: Weight: 55.8 kg     Palliative Assessment/Data:   Flowsheet Rows   Flowsheet Row Most Recent Value  Intake Tab   Referral Department Hospitalist  Unit at Time of Referral Med/Surg Unit  Palliative Care Primary Diagnosis Trauma  Date Notified 03/26/21  Palliative Care Type New Palliative care  Reason for referral Clarify Goals of Care  Date of Admission 03/21/21  Date first seen by Palliative Care 03/27/21  # of days Palliative referral response time 1 Day(s)  # of days IP prior to Palliative referral 5  Clinical Assessment   Palliative Performance Scale Score 20%  Pain Max last 24 hours Not able to report  Pain Min Last 24 hours Not able to report  Dyspnea Max Last 24 Hours Not able to report  Dyspnea Min Last 24 hours Not able to report  Psychosocial & Spiritual Assessment   Palliative Care Outcomes       Time In: 1030 Time Out: 1140 Time Total: 70 minutes  Greater than 50%  of this time was spent counseling and coordinating care related to the above assessment and plan.  Signed by: Drue Novel, NP   Please contact Palliative Medicine Team phone at [phone] for questions and concerns.  For individual provider: See Shea Evans

## 2021-03-27 NOTE — Progress Notes (Signed)
  Speech Language Pathology Treatment: Dysphagia  Patient Details Name: Diane Wise MRN: [id number] DOB: [birth year] Today's Date: 03/27/2021 Time: 6520-7619 SLP Time Calculation (min) (ACUTE ONLY): 27 min  Assessment / Plan / Recommendation Clinical Impression  Pt required encouragement for intake, however she consumed thin via cup/straw (water and orange juice), oatmeal, and limited bites of banana without overt signs or symptoms of aspiration and no oral residuals. Pt accepts only small bites and benefited from slow feeding. She may do well to have Magic Cups offered on trays due to limited po intake. She did accept vanilla Ensure poured over ice with encouragement. Recommend continue diet textures of D3/mech soft and thin liquids via straw sips. No further SLP services indicated at this time, however Pt will need assist and encouragement for po intake due to dementia. SLP will sign off.   HPI HPI: 80 y.o. female with medical history significant for GERD,  dementia, Osteoporosis, HLD and depression and anxiety admitted on 03/21/2021  From [facility] after a mechanical fall and found to have left hip fracture. S/p operative fixation on 03/23/2021. BSE requested.      SLP Plan  Discharge SLP treatment due to (comment);All goals met       Recommendations  Diet recommendations: Dysphagia 3 (mechanical soft);Thin liquid Liquids provided via: Cup;Straw Medication Administration: Whole meds with puree Supervision: Staff to assist with self feeding Compensations: Small sips/bites;Slow rate Postural Changes and/or Swallow Maneuvers: Seated upright 90 degrees;Upright 30-60 min after meal                Oral Care Recommendations: Oral care BID;Staff/trained caregiver to provide oral care Follow up Recommendations: 24 hour supervision/assistance SLP Visit Diagnosis: Dysphagia, unspecified (R13.10) Plan: Discharge SLP treatment due to (comment);All goals met       Thank  you,  Diane Wise, [place]                 [place] 03/27/2021, 1:03 PM

## 2021-03-27 NOTE — Progress Notes (Signed)
EMS arrived to floor to transport patient to [facility] in [place]. IV removed. Patient discharge paper work placed in packet for receiving facility. Attempting to call report again still no answer patient is on [facility] way to [facility] [place]. Will keep trying to call report.

## 2021-04-24 ENCOUNTER — Encounter: Payer: Medicare Other | Admitting: Orthopedic Surgery

## 2021-04-24 DIAGNOSIS — S72002D Fracture of unspecified part of neck of left femur, subsequent encounter for closed fracture with routine healing: Secondary | ICD-10-CM

## 2021-04-26 ENCOUNTER — Emergency Department (HOSPITAL_COMMUNITY): Payer: Medicare Other

## 2021-04-26 ENCOUNTER — Inpatient Hospital Stay (HOSPITAL_COMMUNITY): Payer: Medicare Other

## 2021-04-26 ENCOUNTER — Encounter (HOSPITAL_COMMUNITY): Payer: Self-pay | Admitting: *Deleted

## 2021-04-26 ENCOUNTER — Other Ambulatory Visit: Payer: Self-pay

## 2021-04-26 ENCOUNTER — Inpatient Hospital Stay (HOSPITAL_COMMUNITY)
Admission: EM | Admit: 2021-04-26 | Discharge: 2021-05-03 | DRG: 871 | Disposition: E | Payer: Medicare Other | Source: Skilled Nursing Facility | Attending: Internal Medicine | Admitting: Internal Medicine

## 2021-04-26 DIAGNOSIS — K219 Gastro-esophageal reflux disease without esophagitis: Secondary | ICD-10-CM | POA: Diagnosis present

## 2021-04-26 DIAGNOSIS — N390 Urinary tract infection, site not specified: Secondary | ICD-10-CM

## 2021-04-26 DIAGNOSIS — R578 Other shock: Secondary | ICD-10-CM | POA: Diagnosis not present

## 2021-04-26 DIAGNOSIS — E785 Hyperlipidemia, unspecified: Secondary | ICD-10-CM | POA: Diagnosis present

## 2021-04-26 DIAGNOSIS — R29898 Other symptoms and signs involving the musculoskeletal system: Secondary | ICD-10-CM

## 2021-04-26 DIAGNOSIS — A419 Sepsis, unspecified organism: Secondary | ICD-10-CM | POA: Diagnosis present

## 2021-04-26 DIAGNOSIS — J9690 Respiratory failure, unspecified, unspecified whether with hypoxia or hypercapnia: Secondary | ICD-10-CM

## 2021-04-26 DIAGNOSIS — R7401 Elevation of levels of liver transaminase levels: Secondary | ICD-10-CM

## 2021-04-26 DIAGNOSIS — S72002A Fracture of unspecified part of neck of left femur, initial encounter for closed fracture: Secondary | ICD-10-CM | POA: Diagnosis present

## 2021-04-26 DIAGNOSIS — Z66 Do not resuscitate: Secondary | ICD-10-CM | POA: Diagnosis present

## 2021-04-26 DIAGNOSIS — J189 Pneumonia, unspecified organism: Secondary | ICD-10-CM

## 2021-04-26 DIAGNOSIS — M810 Age-related osteoporosis without current pathological fracture: Secondary | ICD-10-CM | POA: Diagnosis present

## 2021-04-26 DIAGNOSIS — W19XXXD Unspecified fall, subsequent encounter: Secondary | ICD-10-CM | POA: Diagnosis present

## 2021-04-26 DIAGNOSIS — J9621 Acute and chronic respiratory failure with hypoxia: Secondary | ICD-10-CM | POA: Diagnosis present

## 2021-04-26 DIAGNOSIS — R6521 Severe sepsis with septic shock: Secondary | ICD-10-CM | POA: Diagnosis present

## 2021-04-26 DIAGNOSIS — Z833 Family history of diabetes mellitus: Secondary | ICD-10-CM

## 2021-04-26 DIAGNOSIS — E874 Mixed disorder of acid-base balance: Secondary | ICD-10-CM | POA: Diagnosis present

## 2021-04-26 DIAGNOSIS — F0390 Unspecified dementia without behavioral disturbance: Secondary | ICD-10-CM | POA: Diagnosis present

## 2021-04-26 DIAGNOSIS — Z885 Allergy status to narcotic agent status: Secondary | ICD-10-CM

## 2021-04-26 DIAGNOSIS — Z860100 Personal history of colon polyps, unspecified: Secondary | ICD-10-CM

## 2021-04-26 DIAGNOSIS — D72829 Elevated white blood cell count, unspecified: Secondary | ICD-10-CM | POA: Diagnosis present

## 2021-04-26 DIAGNOSIS — Z7982 Long term (current) use of aspirin: Secondary | ICD-10-CM

## 2021-04-26 DIAGNOSIS — K7290 Hepatic failure, unspecified without coma: Secondary | ICD-10-CM | POA: Diagnosis present

## 2021-04-26 DIAGNOSIS — S72002D Fracture of unspecified part of neck of left femur, subsequent encounter for closed fracture with routine healing: Secondary | ICD-10-CM

## 2021-04-26 DIAGNOSIS — F0280 Dementia in other diseases classified elsewhere without behavioral disturbance: Secondary | ICD-10-CM | POA: Diagnosis not present

## 2021-04-26 DIAGNOSIS — I82432 Acute embolism and thrombosis of left popliteal vein: Secondary | ICD-10-CM | POA: Diagnosis present

## 2021-04-26 DIAGNOSIS — Z809 Family history of malignant neoplasm, unspecified: Secondary | ICD-10-CM

## 2021-04-26 DIAGNOSIS — I82462 Acute embolism and thrombosis of left calf muscular vein: Secondary | ICD-10-CM | POA: Diagnosis present

## 2021-04-26 DIAGNOSIS — G309 Alzheimer's disease, unspecified: Secondary | ICD-10-CM | POA: Diagnosis not present

## 2021-04-26 DIAGNOSIS — R739 Hyperglycemia, unspecified: Secondary | ICD-10-CM

## 2021-04-26 DIAGNOSIS — N179 Acute kidney failure, unspecified: Secondary | ICD-10-CM | POA: Diagnosis present

## 2021-04-26 DIAGNOSIS — G9341 Metabolic encephalopathy: Secondary | ICD-10-CM | POA: Diagnosis present

## 2021-04-26 DIAGNOSIS — J9601 Acute respiratory failure with hypoxia: Secondary | ICD-10-CM | POA: Diagnosis not present

## 2021-04-26 DIAGNOSIS — Y95 Nosocomial condition: Secondary | ICD-10-CM | POA: Diagnosis present

## 2021-04-26 DIAGNOSIS — I2699 Other pulmonary embolism without acute cor pulmonale: Secondary | ICD-10-CM | POA: Diagnosis present

## 2021-04-26 DIAGNOSIS — Z20822 Contact with and (suspected) exposure to covid-19: Secondary | ICD-10-CM | POA: Diagnosis present

## 2021-04-26 DIAGNOSIS — R0602 Shortness of breath: Secondary | ICD-10-CM | POA: Diagnosis present

## 2021-04-26 DIAGNOSIS — R413 Other amnesia: Secondary | ICD-10-CM | POA: Diagnosis present

## 2021-04-26 DIAGNOSIS — D689 Coagulation defect, unspecified: Secondary | ICD-10-CM

## 2021-04-26 DIAGNOSIS — J9622 Acute and chronic respiratory failure with hypercapnia: Secondary | ICD-10-CM | POA: Diagnosis present

## 2021-04-26 DIAGNOSIS — E8729 Other acidosis: Secondary | ICD-10-CM

## 2021-04-26 DIAGNOSIS — Z79899 Other long term (current) drug therapy: Secondary | ICD-10-CM

## 2021-04-26 DIAGNOSIS — R9431 Abnormal electrocardiogram [ECG] [EKG]: Secondary | ICD-10-CM

## 2021-04-26 DIAGNOSIS — N183 Chronic kidney disease, stage 3 unspecified: Secondary | ICD-10-CM | POA: Diagnosis present

## 2021-04-26 DIAGNOSIS — Z515 Encounter for palliative care: Secondary | ICD-10-CM

## 2021-04-26 DIAGNOSIS — R570 Cardiogenic shock: Secondary | ICD-10-CM

## 2021-04-26 DIAGNOSIS — R0989 Other specified symptoms and signs involving the circulatory and respiratory systems: Secondary | ICD-10-CM

## 2021-04-26 DIAGNOSIS — E878 Other disorders of electrolyte and fluid balance, not elsewhere classified: Secondary | ICD-10-CM

## 2021-04-26 DIAGNOSIS — R230 Cyanosis: Secondary | ICD-10-CM | POA: Diagnosis present

## 2021-04-26 DIAGNOSIS — E872 Acidosis, unspecified: Secondary | ICD-10-CM

## 2021-04-26 DIAGNOSIS — Z96642 Presence of left artificial hip joint: Secondary | ICD-10-CM | POA: Diagnosis present

## 2021-04-26 DIAGNOSIS — I82412 Acute embolism and thrombosis of left femoral vein: Secondary | ICD-10-CM | POA: Diagnosis present

## 2021-04-26 DIAGNOSIS — T68XXXA Hypothermia, initial encounter: Secondary | ICD-10-CM

## 2021-04-26 DIAGNOSIS — I82403 Acute embolism and thrombosis of unspecified deep veins of lower extremity, bilateral: Secondary | ICD-10-CM

## 2021-04-26 DIAGNOSIS — Z882 Allergy status to sulfonamides status: Secondary | ICD-10-CM

## 2021-04-26 HISTORY — DX: Hyperlipidemia, unspecified: E78.5

## 2021-04-26 HISTORY — DX: Chronic kidney disease, unspecified: N18.9

## 2021-04-26 LAB — CBC WITH DIFFERENTIAL/PLATELET
Abs Immature Granulocytes: 1.08 10*3/uL — ABNORMAL HIGH (ref 0.00–0.07)
Basophils Absolute: 0.1 10*3/uL (ref 0.0–0.1)
Basophils Relative: 1 %
Eosinophils Absolute: 0.1 10*3/uL (ref 0.0–0.5)
Eosinophils Relative: 0 %
HCT: 46 % (ref 36.0–46.0)
Hemoglobin: 13.4 g/dL (ref 12.0–15.0)
Immature Granulocytes: 5 %
Lymphocytes Relative: 16 %
Lymphs Abs: 3.9 10*3/uL (ref 0.7–4.0)
MCH: 28.2 pg (ref 26.0–34.0)
MCHC: 29.1 g/dL — ABNORMAL LOW (ref 30.0–36.0)
MCV: 96.8 fL (ref 80.0–100.0)
Monocytes Absolute: 1.5 10*3/uL — ABNORMAL HIGH (ref 0.1–1.0)
Monocytes Relative: 6 %
Neutro Abs: 17.4 10*3/uL — ABNORMAL HIGH (ref 1.7–7.7)
Neutrophils Relative %: 72 %
Platelets: 237 10*3/uL (ref 150–400)
RBC: 4.75 MIL/uL (ref 3.87–5.11)
RDW: 14.6 % (ref 11.5–15.5)
WBC: 24.1 10*3/uL — ABNORMAL HIGH (ref 4.0–10.5)
nRBC: 0.6 % — ABNORMAL HIGH (ref 0.0–0.2)

## 2021-04-26 LAB — URINALYSIS, ROUTINE W REFLEX MICROSCOPIC
Bilirubin Urine: NEGATIVE
Glucose, UA: 100 mg/dL — AB
Ketones, ur: NEGATIVE mg/dL
Nitrite: POSITIVE — AB
Protein, ur: >300 mg/dL — AB
Specific Gravity, Urine: >1.03 — ABNORMAL HIGH (ref 1.005–1.030)
pH: 5 (ref 5.0–8.0)

## 2021-04-26 LAB — COMPREHENSIVE METABOLIC PANEL
ALT: 63 U/L — ABNORMAL HIGH (ref 0–44)
AST: 65 U/L — ABNORMAL HIGH (ref 15–41)
Albumin: 2.9 g/dL — ABNORMAL LOW (ref 3.5–5.0)
Alkaline Phosphatase: 261 U/L — ABNORMAL HIGH (ref 38–126)
Anion gap: 26 — ABNORMAL HIGH (ref 5–15)
BUN: 84 mg/dL — ABNORMAL HIGH (ref 8–23)
CO2: 11 mmol/L — ABNORMAL LOW (ref 22–32)
Calcium: 8.9 mg/dL (ref 8.9–10.3)
Chloride: 101 mmol/L (ref 98–111)
Creatinine, Ser: 3 mg/dL — ABNORMAL HIGH (ref 0.44–1.00)
GFR, Estimated: 15 mL/min — ABNORMAL LOW (ref >60)
Glucose, Bld: 242 mg/dL — ABNORMAL HIGH (ref 70–99)
Potassium: 5 mmol/L (ref 3.5–5.1)
Sodium: 138 mmol/L (ref 135–145)
Total Bilirubin: 1.3 mg/dL — ABNORMAL HIGH (ref 0.3–1.2)
Total Protein: 7 g/dL (ref 6.5–8.1)

## 2021-04-26 LAB — BLOOD GAS, ARTERIAL
FIO2: 100
O2 Saturation: 33.6 %
Patient temperature: 33.2
pCO2 arterial: 60 mmHg — ABNORMAL HIGH (ref 32.0–48.0)
pH, Arterial: <6.8 — CL (ref 7.350–7.450)

## 2021-04-26 LAB — PROTIME-INR
INR: 2.2 — ABNORMAL HIGH (ref 0.8–1.2)
Prothrombin Time: 24.2 seconds — ABNORMAL HIGH (ref 11.4–15.2)

## 2021-04-26 LAB — URINALYSIS, MICROSCOPIC (REFLEX): RBC / HPF: >50 RBC/hpf (ref 0–5)

## 2021-04-26 LAB — RESP PANEL BY RT-PCR (FLU A&B, COVID) ARPGX2
Influenza A by PCR: NEGATIVE
Influenza B by PCR: NEGATIVE
SARS Coronavirus 2 by RT PCR: NEGATIVE

## 2021-04-26 LAB — APTT: aPTT: 37 seconds — ABNORMAL HIGH (ref 24–36)

## 2021-04-26 LAB — LACTIC ACID, PLASMA: Lactic Acid, Venous: >11 mmol/L (ref 0.5–1.9)

## 2021-04-26 MED ORDER — LACTATED RINGERS IV BOLUS (SEPSIS)
1000.0000 mL | Freq: Once | INTRAVENOUS | Status: AC
  Administered 2021-04-26: 1000 mL via INTRAVENOUS

## 2021-04-26 MED ORDER — LACTATED RINGERS IV SOLN: INTRAVENOUS | Status: DC

## 2021-04-26 MED ORDER — NOREPINEPHRINE 4 MG/250ML-% IV SOLN
2.0000 ug/min | INTRAVENOUS | Status: DC
  Administered 2021-04-26: 30 ug/min via INTRAVENOUS
  Filled 2021-04-26: qty 250

## 2021-04-26 MED ORDER — HEPARIN (PORCINE) 25000 UT/250ML-% IV SOLN: 16.0000 [IU]/kg/h | INTRAVENOUS | Status: DC

## 2021-04-26 MED ORDER — SODIUM CHLORIDE 0.9 % IV SOLN: 250.0000 mL | INTRAVENOUS | Status: DC

## 2021-04-26 MED ORDER — VANCOMYCIN HCL 500 MG/100ML IV SOLN: 500.0000 mg | INTRAVENOUS | Status: DC

## 2021-04-26 MED ORDER — SODIUM CHLORIDE 0.9 % IV SOLN: 2.0000 g | INTRAVENOUS | Status: DC

## 2021-04-26 MED ORDER — LACTATED RINGERS IV BOLUS (SEPSIS)
250.0000 mL | Freq: Once | INTRAVENOUS | Status: AC
  Administered 2021-04-26: 250 mL via INTRAVENOUS

## 2021-04-26 MED ORDER — METRONIDAZOLE 500 MG/100ML IV SOLN
500.0000 mg | Freq: Once | INTRAVENOUS | Status: AC
  Administered 2021-04-26: 500 mg via INTRAVENOUS
  Filled 2021-04-26: qty 100

## 2021-04-26 MED ORDER — LACTATED RINGERS IV BOLUS (SEPSIS)
500.0000 mL | Freq: Once | INTRAVENOUS | Status: AC
  Administered 2021-04-26: 500 mL via INTRAVENOUS

## 2021-04-26 MED ORDER — VANCOMYCIN HCL IN DEXTROSE 1-5 GM/200ML-% IV SOLN: 1000.0000 mg | Freq: Once | INTRAVENOUS | Status: DC

## 2021-04-26 MED ORDER — HEPARIN BOLUS VIA INFUSION
3000.0000 [IU] | Freq: Once | INTRAVENOUS | Status: AC
  Administered 2021-04-26: 3000 [IU] via INTRAVENOUS

## 2021-04-26 MED ORDER — SODIUM CHLORIDE 0.9 % IV SOLN: INTRAVENOUS | Status: DC

## 2021-04-26 MED ORDER — MORPHINE 100MG IN NS 100ML (1MG/ML) PREMIX INFUSION
5.0000 mg/h | INTRAVENOUS | Status: DC
  Administered 2021-04-26: 5 mg/h via INTRAVENOUS
  Filled 2021-04-26: qty 100

## 2021-04-26 MED ORDER — SODIUM CHLORIDE 0.9 % IV SOLN
2.0000 g | Freq: Once | INTRAVENOUS | Status: AC
  Administered 2021-04-26: 2 g via INTRAVENOUS
  Filled 2021-04-26: qty 2

## 2021-04-26 MED ORDER — NOREPINEPHRINE 4 MG/250ML-% IV SOLN
INTRAVENOUS | Status: AC
  Administered 2021-04-26: 2 ug/min via INTRAVENOUS
  Filled 2021-04-26: qty 250

## 2021-04-26 MED ORDER — VANCOMYCIN HCL 1250 MG/250ML IV SOLN
1250.0000 mg | Freq: Once | INTRAVENOUS | Status: AC
  Administered 2021-04-26: 1250 mg via INTRAVENOUS
  Filled 2021-04-26: qty 250

## 2021-04-26 MED ORDER — HEPARIN (PORCINE) 25000 UT/250ML-% IV SOLN
900.0000 [IU]/h | INTRAVENOUS | Status: DC
  Administered 2021-04-26: 900 [IU]/h via INTRAVENOUS
  Filled 2021-04-26: qty 250

## 2021-04-27 LAB — BLOOD GAS, ARTERIAL
Acid-base deficit: 20.6 mmol/L — ABNORMAL HIGH (ref 0.0–2.0)
Bicarbonate: 7.5 mmol/L — ABNORMAL LOW (ref 20.0–28.0)
FIO2: 80
O2 Saturation: 36.6 %
Patient temperature: 36.5
pCO2 arterial: 50 mmHg — ABNORMAL HIGH (ref 32.0–48.0)
pH, Arterial: 6.912 — CL (ref 7.350–7.450)
pO2, Arterial: <31 mmHg — CL (ref 83.0–108.0)

## 2021-04-28 LAB — URINE CULTURE: Culture: <10000 — AB

## 2021-05-01 LAB — CULTURE, BLOOD (ROUTINE X 2)
Culture: NO GROWTH
Culture: NO GROWTH
Special Requests: ADEQUATE

## 2021-05-03 ENCOUNTER — Encounter: Payer: Medicare Other | Admitting: Orthopedic Surgery

## 2021-05-03 NOTE — Death Summary Note (Signed)
DEATH SUMMARY   Patient Details  Name: Diane Wise MRN: [id number] DOB: [birth year]  Admission/Discharge Information   Admit Date:  May 23, 2021  Date of Death: Date of Death: May 23, 2021  Time of Death: Time of Death: 1957/03/[birth year]  Length of Stay: 1  Referring Physician: [name], MD   Reason(s) for Hospitalization   Acute respiratory failure  Diagnoses  Preliminary cause of death:    Septic shock in the setting of pneumonia and UTI, cardiogenic shock most likely in setting of right ventricular failure due to PE, severe electrolyte derangements including severe metabolic acidosis with lactic acid>11.  Secondary Diagnoses (including complications and co-morbidities):  Active Problems:   Severe sepsis with septic shock (HCC)   Cardiogenic shock (HCC)   Lactic acidosis   Pulmonary embolism (HCC)   Respiratory acidosis   Leg DVT (deep venous thromboembolism), acute, bilateral (HCC)   Acute on chronic respiratory failure with hypoxia and hypercapnia (HCC)   Personal history of colonic polyps   Memory disorder   Closed left hip fracture (HCC)   HLD (hyperlipidemia)   Dementia without behavioral disturbance (HCC)   Leukocytosis   AKI (acute kidney injury) ([place])   CKD (chronic kidney disease), stage III (HCC)   Acute metabolic encephalopathy   Hyperglycemia   Increased anion gap metabolic acidosis   Low bicarbonate   Transaminitis   Coagulopathy (HCC)   Acute lower UTI   Prolonged QT interval   Hypothermia   Healthcare-associated pneumonia   Severe muscle deconditioning   Brief Hospital Course (including significant findings, care, treatment, and services provided and events leading to death)  Diane Wise is a 80 y.o. year old female with past medical history  history of osteoporosis, dementia, she is with recent hospitalization secondary to fall, left hip fracture status postsurgical repair 4/21, she was discharged to [facility] 4/25, on aspirin for DVT  prophylaxis,, overall she is with dementia, extremely frail and deconditioned, with recommendation for outpatient palliative care follow-up upon recent discharge from the hospital, she is brought  ED from SNF for shortness of breath for one day, she was transported on nonrebreather, very pale, tachypneic, respiratory rate of 50/min, nonrebreather, cyanotic with blue lips when she arrived to ED, she was immediately intubated upon arrival to ED , he was noted to be mottled, hypothermic 92 degrees, is profoundly hypotensive postintubation, despite receiving fluid bolus and starting pressors ,right IJ line was placed by ED, where PCCM were consulted for respiratory failure and presumed septic shock, patient had bedside echo by PCCM, which did show evidence of right ventricular failure, her work-up was significant for pneumonia, UTI, severe lactic acidosis of 11, hypotension and hypothermia, with elevated white blood cell count at 24K, she is coagulopathic with INR 2.2, she was started empirically on heparin drip for presumed DVT/PE, on high-dose pressors for cardiogenic and septic shock, on broad-spectrum antibiotics for pneumonia and UTI, systolic blood pressure persistently in the 30s despite maximum support, her venous Dopplers came back for occlusive bilateral DVT, appears to be in multiorgan failure, liver failure with transaminitis most likely due to her septic shock, coagulopathic with INR 2.2, with electrolyte abnormalities including severe lactic acidosis, low bicarb level, profoundly encephalopathic, unresponsive despite being off sedation, patient is mottled, systolic blood pressure persistently in the 30s, Discussed with multiple family members including sister, both nieces, as well discussed with husband over the phone, prognosis is very grim, patient is unlikely to survive this, with systolic blood pressure persistently.  Despite high-dose pressors, decision  has been made for withdrawal of cares, which is  very appropriate, but family members were present at bedside, was on morphine drip for comfort, where her pressure was supported, and she was terminally extubated by RT, time of death 7:58 PM, multiple family members at bedside when she passed away, all family questions were answered, and they were appreciated for the care she received.  Patient was referred and accepted as ME  case all this likely originated from her fall.      Pertinent Labs and Studies  Significant Diagnostic Studies US Venous Img Lower Bilateral (DVT)  Result Date: 2021-05-10 CLINICAL DATA:  80 year old female with concern for DVT. EXAM: BILATERAL LOWER EXTREMITY VENOUS DOPPLER ULTRASOUND TECHNIQUE: Gray-scale sonography with graded compression, as well as color Doppler and duplex ultrasound were performed to evaluate the lower extremity deep venous systems from the level of the common femoral vein and including the common femoral, femoral, profunda femoral, popliteal and calf veins including the posterior tibial, peroneal and gastrocnemius veins when visible. The superficial great saphenous vein was also interrogated. Spectral Doppler was utilized to evaluate flow at rest and with distal augmentation maneuvers in the common femoral, femoral and popliteal veins. COMPARISON:  None. FINDINGS: RIGHT LOWER EXTREMITY Common Femoral Vein: No evidence of thrombus. Normal compressibility, respiratory phasicity and response to augmentation. Saphenofemoral Junction: No evidence of thrombus. Normal compressibility and flow on color Doppler imaging. Profunda Femoral Vein: No evidence of thrombus. Normal compressibility and flow on color Doppler imaging. Femoral Vein: No evidence of acute thrombus. There is thickened appearance of the vessel wall with narrowing of the lumen which may represent scarring related to prior thrombus. Popliteal Vein: There is thrombus within the popliteal vein with occlusion of the majority of the lumen. This is of  indeterminate chronicity but an acute DVT is not excluded. There is thickened appearance of the vessel wall. Calf Veins: No evidence of thrombus. Normal compressibility and flow on color Doppler imaging. Other Findings: There is diffuse subcutaneous edema of the distal lower extremity. LEFT LOWER EXTREMITY Common Femoral Vein: There is diffusely thickened vessel wall with narrowing of the lumen, likely related to chronic thrombus and scarring. The vessel remains patent. 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 The saphenofemoral junction remains patent. Profunda Femoral Vein: No evidence of thrombus. Normal compressibility and flow on color Doppler imaging. Femoral Vein: There is occlusive thrombus in the distal femoral vein. Popliteal Vein: There is occlusive thrombus in the popliteal vein. Calf Veins: There is large thrombus in the gastrocnemius vein. The visualized posterior tibial and peroneal veins appear patent. IMPRESSION: 1. Occlusive thrombus in the left distal femoral vein and popliteal vein. 2. Occlusive thrombus in the left gastrocnemius vein. 3. Right popliteal vein thalamus with occlusion of the majority of the lumen. 4. Probable chronic changes and scarring of the left common femoral vein as well as the right femoral vein. These results were called by telephone at the time of interpretation on 05-10-2021 at 6:20 pm to Lajean Manes, MD who verbally acknowledged these results. Electronically Signed   By: Anner Crete M.D.   On: 05/10/2021 18:36   DG Chest Portable 1 View  Result Date: 05/10/21 CLINICAL DATA:  Post intubation. EXAM: PORTABLE CHEST 1 VIEW COMPARISON:  Radiograph earlier today. FINDINGS: Rotated exam. Endotracheal tube tip at the level of clavicles 1.9 cm from the carina. Tip and side port of the enteric tube below the diaphragm in the stomach.  There is a right internal jugular central venous  catheter tip in the region of the lower SVC. Persistent low lung volumes. Stable heart size and mediastinal contours with right hilar prominence, possibly due to rotation. Patchy airspace opacity in the left upper lobe appears similar to earlier today. There is biapical pleuroparenchymal scarring. No pneumothorax or large pleural effusion. Bones under mineralized. Chronic left shoulder arthropathy. Hiatal hernia on prior exam not well appreciated. IMPRESSION: 1. Endotracheal tube tip 1.9 cm from the carina. 2. Enteric tube tip and side-port below the diaphragm in the stomach. 3. Right internal jugular central venous catheter tip in the lower SVC. 4. Persistent low lung volumes with patchy left upper lobe airspace opacity suspicious for pneumonia. Electronically Signed   By: Keith Rake M.D.   On: 2021-05-03 14:55   DG Chest Port 1 View  Result Date: May 03, 2021 CLINICAL DATA:  Questionable sepsis. EXAM: PORTABLE CHEST 1 VIEW COMPARISON:  03/21/2021. FINDINGS: Patient is rotated to the right. Borderline cardiomegaly. No pulmonary venous congestion. Questionable right perihilar density. This may be secondary to overlapping vessels. A follow-up PA and lateral chest x-ray is suggested for further evaluation. Biapical pleural-parenchymal thickening consistent scarring. Left apical infiltrate cannot be excluded. This can also be further evaluated on follow-up exam. Hiatal hernia noted. Degenerative change thoracic spine. IMPRESSION: 1. Questionable right perihilar density. This may be secondary to overlapping vessels. Follow-up PA lateral chest x-ray suggested for further evaluation. 2. Biapical pleural-parenchymal thickening consistent scarring. Left apical infiltrate cannot be excluded. This can also be further evaluated on follow-up exam. 3.  Borderline cardiomegaly. 4.  Hiatal hernia. Electronically Signed   By: Marcello Moores  Register   On: 05-03-2021 13:34    Microbiology Recent Results (from the past 240  hour(s))  Blood Culture (routine x 2)     Status: None (Preliminary result)   Collection Time: 05-03-21 11:53 AM   Specimen: BLOOD  Result Value Ref Range Status   Specimen Description BLOOD RIGHT ANTECUBITAL  Final   Special Requests   Final    BOTTLES DRAWN AEROBIC AND ANAEROBIC Blood Culture adequate volume Performed at [facility], [address] [place], [place] [address]    Culture PENDING  Incomplete   Report Status PENDING  Incomplete  Blood Culture (routine x 2)     Status: None (Preliminary result)   Collection Time: 05/03/2021 11:58 AM   Specimen: BLOOD  Result Value Ref Range Status   Specimen Description BLOOD LEFT ANTECUBITAL  Final   Special Requests   Final    BOTTLES DRAWN AEROBIC ONLY Blood Culture results may not be optimal due to an inadequate volume of blood received in culture bottles Performed at [facility], [address] [place], [place] [address]    Culture PENDING  Incomplete   Report Status PENDING  Incomplete  Resp Panel by RT-PCR (Flu A&B, Covid) In/Out Cath Urine     Status: None   Collection Time: 05-03-21  1:44 PM   Specimen: In/Out Cath Urine; Nasopharyngeal(NP) swabs in vial transport medium  Result Value Ref Range Status   SARS Coronavirus 2 by RT PCR NEGATIVE NEGATIVE Final    Comment: (NOTE) SARS-CoV-2 target nucleic acids are NOT DETECTED.  The SARS-CoV-2 RNA is generally detectable in upper respiratory specimens during the acute phase of infection. The lowest concentration of SARS-CoV-2 viral copies this assay can detect is 138 copies/mL. A negative result does not preclude SARS-Cov-2 infection and should not be used as the sole basis for treatment or other  patient management decisions. A negative result may occur with  improper specimen collection/handling, submission of specimen other than nasopharyngeal swab, presence of viral mutation(s) within the areas targeted by this assay, and inadequate number of viral copies(<138 copies/mL).  A negative result must be combined with clinical observations, patient history, and epidemiological information. The expected result is Negative.  Fact Sheet for Patients:  EntrepreneurPulse.com.au  Fact Sheet for Healthcare Providers:  IncredibleEmployment.be  This test is no t yet approved or cleared by the [place] FDA and  has been authorized for detection and/or diagnosis of SARS-CoV-2 by FDA under an Emergency Use Authorization (EUA). This EUA will remain  in effect (meaning this test can be used) for the duration of the COVID-19 declaration under Section 564(b)(1) of the Act, 21 U.S.C.section 360bbb-3(b)(1), unless the authorization is terminated  or revoked sooner.       Influenza A by PCR NEGATIVE NEGATIVE Final   Influenza B by PCR NEGATIVE NEGATIVE Final    Comment: (NOTE) The Xpert Xpress SARS-CoV-2/FLU/RSV plus assay is intended as an aid in the diagnosis of influenza from Nasopharyngeal swab specimens and should not be used as a sole basis for treatment. Nasal washings and aspirates are unacceptable for Xpert Xpress SARS-CoV-2/FLU/RSV testing.  Fact Sheet for Patients: EntrepreneurPulse.com.au  Fact Sheet for Healthcare Providers: IncredibleEmployment.be  This test is not yet approved or cleared by the [place] FDA and has been authorized for detection and/or diagnosis of SARS-CoV-2 by FDA under an Emergency Use Authorization (EUA). This EUA will remain in effect (meaning this test can be used) for the duration of the COVID-19 declaration under Section 564(b)(1) of the Act, 21 U.S.C. section 360bbb-3(b)(1), unless the authorization is terminated or revoked.  Performed at [facility], [address], [place], [place] [address]     Lab Basic Metabolic Panel: Recent Labs  Lab 05/04/2021 1153  NA 138  K 5.0  CL 101  CO2 11*  GLUCOSE 242*  BUN 84*  CREATININE 3.00*   CALCIUM 8.9   Liver Function Tests: Recent Labs  Lab 05-04-2021 1153  AST 65*  ALT 63*  ALKPHOS 261*  BILITOT 1.3*  PROT 7.0  ALBUMIN 2.9*   No results for input(s): LIPASE, AMYLASE in the last 168 hours. No results for input(s): AMMONIA in the last 168 hours. CBC: Recent Labs  Lab 04-May-2021 1153  WBC 24.1*  NEUTROABS 17.4*  HGB 13.4  HCT 46.0  MCV 96.8  PLT 237   Cardiac Enzymes: No results for input(s): CKTOTAL, CKMB, CKMBINDEX, TROPONINI in the last 168 hours. Sepsis Labs: Recent Labs  Lab 05/04/21 1153  WBC 24.1*  LATICACIDVEN >11*    Procedures/Operations  Intubation, right IJ TLC   Kymani Shimabukuro May 04, 2021, 9:26 PM

## 2021-05-03 NOTE — Progress Notes (Signed)
Pharmacy Antibiotic Note  Diane Wise is a 80 y.o. female admitted on May 19, 2021 with AKI and unknown source of infection.  Pharmacy has been consulted for Vancomycin and cefepime dosing.  Plan: Vancomycin 1250 mg IV x 1 dose. Vancomycin 500 mg IV every 48 hours. Cefepime 2000 mg IV every 24 hours. Monitor labs, c/s, and vanco level as indicated.  Height: 5\' 4"  (162.6 cm) Weight: 55.8 kg (123 lb 0.3 oz) IBW/kg (Calculated) : 54.7  Temp (24hrs), Avg:94.8 F (34.9 C), Min:93.5 F (34.2 C), Max:96 F (35.6 C)  Recent Labs  Lab 2021/05/19 1153  WBC 24.1*  CREATININE 3.00*  LATICACIDVEN >11*    Estimated Creatinine Clearance: 12.7 mL/min (A) (by C-G formula based on SCr of 3 mg/dL (H)).    Allergies  Allergen Reactions  . Codeine Nausea And Vomiting  . Sulfa Antibiotics Nausea And Vomiting    Antimicrobials this admission: Vanco 5/25 >>  Cefepime 5/25 >> Flagyl 5/25 x 1    Microbiology results: 5/25 BCx: pending 5/25 UCx: pending    Thank you for allowing pharmacy to be a part of this patient's care.  Ramond Craver May 19, 2021 2:09 PM

## 2021-05-03 NOTE — ED Notes (Signed)
Stopped norepi gtt per md Elgergawy instructions

## 2021-05-03 NOTE — ED Notes (Signed)
Ultra sound at bedside doing doppler of lower extremities.

## 2021-05-03 NOTE — ED Notes (Signed)
R ej central line placed by md.

## 2021-05-03 NOTE — ED Notes (Signed)
Pt in bed, pt satting 96% on O2 sat monitor, pt pale and appears mottled, pt warm to the touch, bear hugger in place, temp foley monitor states temp 33.6c.  hospitalist aware of bp.

## 2021-05-03 NOTE — Sepsis Progress Note (Signed)
eLink is following for Code Sepsis

## 2021-05-03 NOTE — ED Notes (Signed)
md and rt at bedside, extubated pt and removed Og tube.

## 2021-05-03 NOTE — ED Notes (Signed)
RT at bedside having difficulty getting ABG, second RT at bedside attempting,

## 2021-05-03 NOTE — ED Notes (Signed)
Pt placed on high flow [place] at 15L

## 2021-05-03 NOTE — ED Notes (Signed)
Per intensivist he has talked with husband and pt is now DNR, no cpr and no cardioversion, pt bp is 31/15, instructed to increase norepi to 37mcg/min

## 2021-05-03 NOTE — ED Notes (Signed)
Per md start morphine gtt, and then stop norepi gtt after 10 minutes

## 2021-05-03 NOTE — ED Provider Notes (Addendum)
Diane Wise   CSN: [id number] Arrival date & time: 05-11-2021  1137     History Chief Complaint  Patient presents with  . Shortness of Breath    Diane Wise is a 80 y.o. female.  Patient presents from nursing home with shortness of breath.  Reportedly complaining of shortness of breath since yesterday.  Unable to obtain additional history review of systems given her serious condition.        Past Medical History:  Diagnosis Date  . Chronic kidney disease   . Complication of anesthesia    SLOW TO WAKE UP  . GERD (gastroesophageal reflux disease)   . Hyperlipidemia   . Memory disorder 07/31/2018  . Osteoporosis     Patient Active Problem List   Diagnosis Date Noted  . GERD (gastroesophageal reflux disease) 03/22/2021  . HLD (hyperlipidemia) 03/22/2021  . Anxiety and depression 03/22/2021  . Dementia without behavioral disturbance ([place]) 03/22/2021  . Leukocytosis 03/22/2021  . Closed left hip fracture ([place]) 03/21/2021  . Memory disorder 07/31/2018  . Personal history of colonic polyps 02/13/2013    Past Surgical History:  Procedure Laterality Date  . ABDOMINAL HYSTERECTOMY  1984  . APPENDECTOMY  05/20/13  . HIP ARTHROPLASTY Left 03/23/2021   Procedure: PARTIAL LEFT HIP REPLACEMENT, BIPOLAR;  Surgeon: Carole Civil, MD;  Location: [facility];  Service: Orthopedics;  Laterality: Left;  . LAPAROSCOPIC APPENDECTOMY N/A 05/20/2013   Procedure: APPENDECTOMY LAPAROSCOPIC;  Surgeon: Leighton Ruff, MD;  Location: [facility];  Service: General;  Laterality: N/A;  . TONSILLECTOMY       OB History   No obstetric history on file.     Family History  Problem Relation Age of Onset  . Cancer Father   . Diabetes Sister   . Diabetes Brother     Social History   Tobacco Use  . Smoking status: Never Smoker  . Smokeless tobacco: Never Used  Vaping Use  . Vaping Use: Never used  Substance Use Topics  . Alcohol use: No  . Drug use: No     Home Medications Prior to Admission medications   Medication Sig Start Date End Date Taking? Authorizing Provider  acetaminophen (TYLENOL) 325 MG tablet Take 2 tablets (650 mg total) by mouth every 4 (four) hours as needed. 05/26/75  Yes Leighton Ruff, MD  aspirin EC 325 MG EC tablet Take 1 tablet (325 mg total) by mouth daily with breakfast. 03/28/21  Yes Emokpae, Courage, MD  atorvastatin (LIPITOR) 10 MG tablet Take 10 mg by mouth daily.   Yes [provider]  cyanocobalamin 100 MCG tablet Take 100 mcg by mouth daily.   Yes [provider]  donepezil (ARICEPT) 5 MG tablet Take 1 tablet (5 mg total) by mouth at bedtime. 07/31/18  Yes Kathrynn Ducking, MD  mirtazapine (REMERON) 7.5 MG tablet Take 1 tablet (7.5 mg total) by mouth at bedtime. 03/27/21  Yes Roxan Hockey, MD  Multiple Vitamin (MULTIVITAMIN) capsule Take 1 capsule by mouth daily.   Yes [provider]  omeprazole (PRILOSEC OTC) 20 MG tablet Take 1 tablet (20 mg total) by mouth daily. 03/27/21 03/27/22 Yes Emokpae, Courage, MD  ondansetron (ZOFRAN) 4 MG tablet Take 1 tablet (4 mg total) by mouth every 6 (six) hours as needed for nausea. 03/27/21  Yes Emokpae, Courage, MD  QUEtiapine (SEROQUEL) 25 MG tablet Take 1,205 mg by mouth at bedtime as needed (for sleep/anxiety).   Yes [provider]  senna-docusate (SENOKOT-S)  8.6-50 MG tablet Take 2 tablets by mouth at bedtime. 03/27/21  Yes Emokpae, Courage, MD  sodium chloride (OCEAN) 0.65 % nasal spray Place 1 spray into the nose daily as needed for congestion.   Yes [provider]  traMADol (ULTRAM) 50 MG tablet Take 1 tablet (50 mg total) by mouth every 6 (six) hours. 03/27/21  Yes Emokpae, Courage, MD  LORazepam (ATIVAN) 0.5 MG tablet Take 1 tablet (0.5 mg total) by mouth every 8 (eight) hours as needed for anxiety, sedation or sleep. Patient not taking: No sig reported 03/27/21   Roxan Hockey, MD    Allergies    Codeine and Sulfa  antibiotics  Review of Systems   Review of Systems  Unable to perform ROS: Acuity of condition    Physical Exam Updated Vital Signs BP (!) 51/19   Pulse 92   Temp (!) 91.8 F (33.2 C)   Resp 16   Ht _0  (1.626 m)   Wt 55.8 kg   SpO2 (!) 78%   BMI 21.12 kg/m   Physical Exam Constitutional:      Appearance: Normal appearance. She is ill-appearing and toxic-appearing.     Comments: Patient appears mottled, bluish discoloration diffusely.  HENT:     Head: Normocephalic.     Nose: Nose normal.  Eyes:     Extraocular Movements: Extraocular movements intact.  Cardiovascular:     Rate and Rhythm: Normal rate.  Pulmonary:     Effort: Pulmonary effort is normal.  Musculoskeletal:        General: Normal range of motion.     Cervical back: Normal range of motion.  Neurological:     Mental Status: Mental status is at baseline.     Comments: Patient is awake, eyes closed, does not follow commands.  Appears purposeful, appears to be moving all extremities but significantly obtunded.     ED Results / Procedures / Treatments   Labs (all labs ordered are listed, but only abnormal results are displayed) Labs Reviewed  LACTIC ACID, PLASMA - Abnormal; Notable for the following components:      Result Value   Lactic Acid, Venous >11 (*)    All other components within normal limits  COMPREHENSIVE METABOLIC PANEL - Abnormal; Notable for the following components:   CO2 11 (*)    Glucose, Bld 242 (*)    BUN 84 (*)    Creatinine, Ser 3.00 (*)    Albumin 2.9 (*)    AST 65 (*)    ALT 63 (*)    Alkaline Phosphatase 261 (*)    Total Bilirubin 1.3 (*)    GFR, Estimated 15 (*)    Anion gap 26 (*)    All other components within normal limits  CBC WITH DIFFERENTIAL/PLATELET - Abnormal; Notable for the following components:   WBC 24.1 (*)    MCHC 29.1 (*)    nRBC 0.6 (*)    Neutro Abs 17.4 (*)    Monocytes Absolute 1.5 (*)    Abs Immature Granulocytes 1.08 (*)    All other  components within normal limits  BLOOD GAS, ARTERIAL - Abnormal; Notable for the following components:   pH, Arterial 6.912 (*)    pCO2 arterial 50.0 (*)    pO2, Arterial <31 (*)    Bicarbonate 7.5 (*)    Acid-base deficit 20.6 (*)    All other components within normal limits  PROTIME-INR - Abnormal; Notable for the following components:   Prothrombin Time 24.2 (*)  INR 2.2 (*)    All other components within normal limits  APTT - Abnormal; Notable for the following components:   aPTT 37 (*)    All other components within normal limits  CULTURE, BLOOD (ROUTINE X 2)  CULTURE, BLOOD (ROUTINE X 2)  RESP PANEL BY RT-PCR (FLU A&B, COVID) ARPGX2  URINE CULTURE  LACTIC ACID, PLASMA  URINALYSIS, ROUTINE W REFLEX MICROSCOPIC  BLOOD GAS, ARTERIAL    EKG EKG Interpretation  Date/Time:  05-01-2021 11:48:36 EDT Ventricular Rate:  103 PR Interval:  160 QRS Duration: 165 QT Interval:  389 QTC Calculation: 510 R Axis:   89 Text Interpretation: Sinus tachycardia Biatrial enlargement Right bundle branch block Anteroseptal infarct, age indeterminate Confirmed by Thamas Jaegers ([id number]) on 2021-05-01 12:52:48 PM   Radiology DG Chest Port 1 View  Result Date: 05/01/21 CLINICAL DATA:  Questionable sepsis. EXAM: PORTABLE CHEST 1 VIEW COMPARISON:  03/21/2021. FINDINGS: Patient is rotated to the right. Borderline cardiomegaly. No pulmonary venous congestion. Questionable right perihilar density. This may be secondary to overlapping vessels. A follow-up PA and lateral chest x-ray is suggested for further evaluation. Biapical pleural-parenchymal thickening consistent scarring. Left apical infiltrate cannot be excluded. This can also be further evaluated on follow-up exam. Hiatal hernia noted. Degenerative change thoracic spine. IMPRESSION: 1. Questionable right perihilar density. This may be secondary to overlapping vessels. Follow-up PA lateral chest x-ray suggested for further evaluation. 2.  Biapical pleural-parenchymal thickening consistent scarring. Left apical infiltrate cannot be excluded. This can also be further evaluated on follow-up exam. 3.  Borderline cardiomegaly. 4.  Hiatal hernia. Electronically Signed   By: Marcello Moores  Register   On: 2021/05/01 13:34    Procedures .Critical Care Performed by: Luna Fuse, MD Authorized by: Luna Fuse, MD   Critical care provider statement:    Critical care time (minutes):  45   Critical care was time spent personally by me on the following activities:  Discussions with consultants, evaluation of patient's response to treatment, examination of patient, ordering and performing treatments and interventions, ordering and review of laboratory studies, ordering and review of radiographic studies, pulse oximetry, re-evaluation of patient's condition, obtaining history from patient or surrogate and review of old charts Procedure Name: Intubation Date/Time: May 01, 2021 2:56 PM Performed by: Luna Fuse, MD Pre-anesthesia Checklist: Patient identified, Patient being monitored, Emergency Drugs available, Timeout performed and Suction available Oxygen Delivery Method: Non-rebreather mask Preoxygenation: Pre-oxygenation with 100% oxygen Induction Type: Rapid sequence Ventilation: Mask ventilation without difficulty Placement Confirmation: ETT inserted through vocal cords under direct vision,  CO2 detector and Breath sounds checked- equal and bilateral Tube secured with: ETT holder Comments: MAC 3 blade used with glide scope.  Cords visualized clearly.  7.5 ET tube advanced to 23 at the teeth and secured.  End-tidal CO2 parameter appears normal with good color change breath sounds are equal bilaterally.    Linus Salmons Line  Date/Time: 05-01-21 2:56 PM Performed by: Luna Fuse, MD Authorized by: Luna Fuse, MD   Comments:     Right-sided IJ central line was placed by myself under ultrasound guidance and maximal sterile barrier  technique using Seldinger technique.  Patient tolerated procedure well, good return of blood from all 3 ports of nonpulsatile blood.  Secured with Biopatch and sutures.     Medications Ordered in ED Medications  lactated ringers infusion ( Intravenous New Bag/Given 05-01-21 1437)  vancomycin (VANCOREADY) IVPB 1250 mg/250 mL (1,250 mg Intravenous New Bag/Given 05-01-2021 1433)  0.9 %  sodium chloride infusion (has no administration in time range)  norepinephrine (LEVOPHED) 33m in 2573mpremix infusion (9 mcg/min Intravenous Rate/Dose Change 04/08/03/22453)  ceFEPIme (MAXIPIME) 2 g in sodium chloride 0.9 % 100 mL IVPB (has no administration in time range)  vancomycin (VANCOREADY) IVPB 500 mg/100 mL (has no administration in time range)  lactated ringers bolus 1,000 mL (0 mLs Intravenous Stopped 5/Jun 04, 2022425)    And  lactated ringers bolus 500 mL (0 mLs Intravenous Stopped 5/June 04, 2022323)    And  lactated ringers bolus 250 mL (0 mLs Intravenous Stopped 5/Jun 04, 2022323)  ceFEPIme (MAXIPIME) 2 g in sodium chloride 0.9 % 100 mL IVPB (0 g Intravenous Stopped 04/2021-06-04425)  metroNIDAZOLE (FLAGYL) IVPB 500 mg (0 mg Intravenous Stopped 04/2021/06/04339)    ED Course  I have reviewed the triage vital signs and the nursing notes.  Pertinent labs & imaging results that were available during my care of the patient were reviewed by me and considered in my medical decision making (see chart for details).    MDM Rules/Calculators/A&P                          Patient appeared in extremis, pale blue lips extremities appear mottled and bluish and discolored.  Vital signs very difficult obtain extremities very cold.  O2 saturation on pulse oximeter appears unreliable at times reading 80% intensity 90% at times reading 30%.  Patient clearly appeared sick likely septic.  I spoke with her husband who wanted "everything possible to be done."  I also spoke with her financial power of attorney her grand niece who also states  that she wants everything possible done.  Patient otherwise does not appear to have an advanced directive.  Labs are sent white count 24 lactic acid greater than 11 temperatures severely hypothermic.  Patient given a warming blanket, culture sent and broad-spectrum antibiotic started.  30 cc/kg IV fluid hydration initiated.  On nonrebreather, O2 saturation appears to improved to about 90% regularly.  She was then transition to high flow nasal cannula but appeared not to do well on this and desatted back down to 50%.  At this point decision made to intubate the patient.  Post intubation blood pressure dropped to the 30s and 40s, placed on Levophed.  [facility]ult[facility] with critical care [facility]  Final Clinical Impression(s) / ED Diagnoses Final diagnoses:  Severe sepsis ([facility] Respiratory failure, unspecified chronicity, unspecified whether with hypoxia or hypercapnia ([facility] ([facility]   Rx / DC Orders ED Discharge Orders    None       HoLuna FuseMD 05June 04, [id number]    HoLuna FuseMD [id number]

## 2021-05-03 NOTE — ED Notes (Signed)
1328-10mg  Etomidate given 1328-50mg  Rocuronium given 1330-pt intubated with glidescope by Dr. Almyra Free. 7.5 ett, 23cm @ teeth, + color change and breath sounds auscultated. Chest x-ray ordered.

## 2021-05-03 NOTE — ED Notes (Signed)
md at bedside for cental line placement, pt started on norepi, pt sinus/ sinus tach on monitor.

## 2021-05-03 NOTE — ED Notes (Signed)
Pt in bed, unable to get a blood pressure, pt has femoral pulse via doppler, hospitalist at bedside, states that pt will be comfort care and to be started on morphine gtt.

## 2021-05-03 NOTE — Progress Notes (Signed)
ANTICOAGULATION CONSULT NOTE - Initial Consult  Pharmacy Consult for heparin Indication: VTE  Allergies  Allergen Reactions  . Codeine Nausea And Vomiting  . Sulfa Antibiotics Nausea And Vomiting    Patient Measurements: Height: 5\' 4"  (162.6 cm) Weight: 55.8 kg (123 lb 0.3 oz) IBW/kg (Calculated) : 54.7 Heparin Dosing Weight: 55.8 kg  Vital Signs: Temp: 91.8 F (33.2 C) (05/25 1500) Temp Source: Rectal (05/25 1345) BP: 55/15 (05/25 1500) Pulse Rate: 98 (05/25 1500)  Labs: Recent Labs    05/25/2021 1153 05/25/21 1210  HGB 13.4  --   HCT 46.0  --   PLT 237  --   APTT  --  37*  LABPROT  --  24.2*  INR  --  2.2*  CREATININE 3.00*  --     Estimated Creatinine Clearance: 12.7 mL/min (A) (by C-G formula based on SCr of 3 mg/dL (H)).   Medical History: Past Medical History:  Diagnosis Date  . Chronic kidney disease   . Complication of anesthesia    SLOW TO WAKE UP  . GERD (gastroesophageal reflux disease)   . Hyperlipidemia   . Memory disorder 07/31/2018  . Osteoporosis     Medications:  (Not in a hospital admission)   Assessment: Pharmacy consulted to dose heparin in patient with VTE.  Patient is not on anticoagulation prior to admission.  CBC WNL  Goal of Therapy:  Heparin level 0.3-0.7 units/ml Monitor platelets by anticoagulation protocol: Yes   Plan:  Give 3000 units bolus x 1 Start heparin infusion at 900 units/hr Check anti-Xa level in 8 hours and daily while on heparin Continue to monitor H&H and platelets  Ramond Craver May 25, 2021,3:13 PM

## 2021-05-03 NOTE — H&P (Addendum)
TRH H&P   Patient Demographics:    Diane Wise, is a 80 y.o. female  MRN: [id number]   DOB - [birth year]  Admit Date - 2021/05/20  Outpatient Primary MD for the patient is [name], MD  Referring MD/NP/PA: Dr Almyra Free  Patient coming from: SNF  Chief Complaint  Patient presents with  . Shortness of Breath      HPI:    Diane Wise  is a 80 y.o. female, fecal history of osteoporosis, dementia, she is with recent hospitalization secondary to fall, left hip fracture status postsurgical repair at, overall she is with dementia, extremely frail and deconditioned, with recommendation for outpatient palliative care follow-up, to ED from SNF for shortness of breath for 1 day, she was transported on nonrebreather, very pale, atraumatic, on arrival to ED she was admitted intubated, he was noted to be mottled, hypothermic 92 degrees, she received fluid bolus, started on this, for hypotension, right IJ line was placed by ED, where PCCM were consulted for respiratory failure and presumed septic shock, patient had bedside echo by PCCM, which noted to be with right V failure, her work-up was significant for pneumonia, UTI, severe lactic acidosis of 11, hypotension and hypothermia, with elevated white blood cell count at 20 4K, she is coagulopathic with INR 2.2, she was started on heparin drip for presumed DVT/PE, on pressors for cardiac genic/septic shock, on broad-spectrum antibiotics for pneumonia and UTI, systolic blood pressure persistently in the 30s despite maximum support, [facility] hospitalist consulted to admit.    Review of systems:   Unable to obtain review of system given her mental status   With Past History of the following :    Past Medical History:  Diagnosis Date  . Chronic kidney disease   . Complication of anesthesia    SLOW TO WAKE UP  . GERD (gastroesophageal reflux  disease)   . Hyperlipidemia   . Memory disorder 07/31/2018  . Osteoporosis       Past Surgical History:  Procedure Laterality Date  . ABDOMINAL HYSTERECTOMY  1984  . APPENDECTOMY  05/20/13  . HIP ARTHROPLASTY Left 03/23/2021   Procedure: PARTIAL LEFT HIP REPLACEMENT, BIPOLAR;  Surgeon: Carole Civil, MD;  Location: [facility];  Service: Orthopedics;  Laterality: Left;  . LAPAROSCOPIC APPENDECTOMY N/A 05/20/2013   Procedure: APPENDECTOMY LAPAROSCOPIC;  Surgeon: Leighton Ruff, MD;  Location: [facility];  Service: General;  Laterality: N/A;  . TONSILLECTOMY        Social History:     Social History   Tobacco Use  . Smoking status: Never Smoker  . Smokeless tobacco: Never Used  Substance Use Topics  . Alcohol use: No       Family History :     Family History  Problem Relation Age of Onset  . Cancer Father   . Diabetes Sister   . Diabetes Brother  Home Medications:   Prior to Admission medications   Medication Sig Start Date End Date Taking? Authorizing Provider  acetaminophen (TYLENOL) 325 MG tablet Take 2 tablets (650 mg total) by mouth every 4 (four) hours as needed. 1/32/44  Yes Leighton Ruff, MD  aspirin EC 325 MG EC tablet Take 1 tablet (325 mg total) by mouth daily with breakfast. 03/28/21  Yes Emokpae, Courage, MD  atorvastatin (LIPITOR) 10 MG tablet Take 10 mg by mouth daily.   Yes [provider]  cyanocobalamin 100 MCG tablet Take 100 mcg by mouth daily.   Yes [provider]  donepezil (ARICEPT) 5 MG tablet Take 1 tablet (5 mg total) by mouth at bedtime. 07/31/18  Yes Kathrynn Ducking, MD  mirtazapine (REMERON) 7.5 MG tablet Take 1 tablet (7.5 mg total) by mouth at bedtime. 03/27/21  Yes Roxan Hockey, MD  Multiple Vitamin (MULTIVITAMIN) capsule Take 1 capsule by mouth daily.   Yes [provider]  omeprazole (PRILOSEC OTC) 20 MG tablet Take 1 tablet (20 mg total) by mouth daily. 03/27/21 03/27/22 Yes Emokpae, Courage, MD   ondansetron (ZOFRAN) 4 MG tablet Take 1 tablet (4 mg total) by mouth every 6 (six) hours as needed for nausea. 03/27/21  Yes Emokpae, Courage, MD  QUEtiapine (SEROQUEL) 25 MG tablet Take 1,205 mg by mouth at bedtime as needed (for sleep/anxiety).   Yes [provider]  senna-docusate (SENOKOT-S) 8.6-50 MG tablet Take 2 tablets by mouth at bedtime. 03/27/21  Yes Emokpae, Courage, MD  sodium chloride (OCEAN) 0.65 % nasal spray Place 1 spray into the nose daily as needed for congestion.   Yes [provider]  traMADol (ULTRAM) 50 MG tablet Take 1 tablet (50 mg total) by mouth every 6 (six) hours. 03/27/21  Yes Emokpae, Courage, MD  LORazepam (ATIVAN) 0.5 MG tablet Take 1 tablet (0.5 mg total) by mouth every 8 (eight) hours as needed for anxiety, sedation or sleep. Patient not taking: No sig reported 03/27/21   Roxan Hockey, MD     Allergies:     Allergies  Allergen Reactions  . Codeine Nausea And Vomiting  . Sulfa Antibiotics Nausea And Vomiting     Physical Exam:   Vitals  Blood pressure (!) 33/19, pulse (!) 103, temperature (!) 92.3 F (33.5 C), resp. rate (!) 22, height 5\' 4"  (1.626 m), weight 55.8 kg, SpO2 100 %.   Elderly  woman, acutely ill, intubated, unresponsive Cyanotic, pale, no JVD lungs with equal breath sound bilaterally, no use of accessory muscles as she has ventilated, does not breathe over the vent Cardiovascular with S1, S2, regular, sinus, femoral pulses only palpable by venous Dopplers, extremities are mottled, unable to detect peripheral pulses Abdomen soft, nontender, no organomegaly Extremities noted, cyanotic, with diminished pulses Neurology, she is unresponsive despite being off sedation Joint appears normal, left hip surgical scar healed     Data Review:    CBC Recent Labs  Lab May 01, 2021 1153  WBC 24.1*  HGB 13.4  HCT 46.0  PLT 237  MCV 96.8  MCH 28.2  MCHC 29.1*  RDW 14.6  LYMPHSABS 3.9  MONOABS 1.5*  EOSABS 0.1   BASOSABS 0.1   ------------------------------------------------------------------------------------------------------------------  Chemistries  Recent Labs  Lab 05-01-2021 1153  NA 138  K 5.0  CL 101  CO2 11*  GLUCOSE 242*  BUN 84*  CREATININE 3.00*  CALCIUM 8.9  AST 65*  ALT 63*  ALKPHOS 261*  BILITOT 1.3*   ------------------------------------------------------------------------------------------------------------------ estimated creatinine clearance is 12.7  mL/min (A) (by C-G formula based on SCr of 3 mg/dL (H)). ------------------------------------------------------------------------------------------------------------------ No results for input(s): TSH, T4TOTAL, T3FREE, THYROIDAB in the last 72 hours.  Invalid input(s): FREET3  Coagulation profile Recent Labs  Lab May 13, 2021 1210  INR 2.2*   ------------------------------------------------------------------------------------------------------------------- No results for input(s): DDIMER in the last 72 hours. -------------------------------------------------------------------------------------------------------------------  Cardiac Enzymes No results for input(s): CKMB, TROPONINI, MYOGLOBIN in the last 168 hours.  Invalid input(s): CK ------------------------------------------------------------------------------------------------------------------ No results found for: BNP   ---------------------------------------------------------------------------------------------------------------  Urinalysis    Component Value Date/Time   COLORURINE AMBER (A) 05/13/21 1153   APPEARANCEUR HAZY (A) May 13, 2021 1153   LABSPEC >1.030 (H) 05-13-2021 1153   PHURINE 5.0 May 13, 2021 1153   GLUCOSEU 100 (A) 2021/05/13 1153   HGBUR LARGE (A) 05-13-21 [place] 05-13-2021 1153   KETONESUR NEGATIVE 2021/05/13 1153   PROTEINUR >300 (A) May 13, 2021 1153   NITRITE POSITIVE (A) 05-13-21 1153   LEUKOCYTESUR  TRACE (A) May 13, 2021 1153    ----------------------------------------------------------------------------------------------------------------   Imaging Results:    DG Chest Portable 1 View  Result Date: 05/13/2021 CLINICAL DATA:  Post intubation. EXAM: PORTABLE CHEST 1 VIEW COMPARISON:  Radiograph earlier today. FINDINGS: Rotated exam. Endotracheal tube tip at the level of clavicles 1.9 cm from the carina. Tip and side port of the enteric tube below the diaphragm in the stomach. There is a right internal jugular central venous catheter tip in the region of the lower SVC. Persistent low lung volumes. Stable heart size and mediastinal contours with right hilar prominence, possibly due to rotation. Patchy airspace opacity in the left upper lobe appears similar to earlier today. There is biapical pleuroparenchymal scarring. No pneumothorax or large pleural effusion. Bones under mineralized. Chronic left shoulder arthropathy. Hiatal hernia on prior exam not well appreciated. IMPRESSION: 1. Endotracheal tube tip 1.9 cm from the carina. 2. Enteric tube tip and side-port below the diaphragm in the stomach. 3. Right internal jugular central venous catheter tip in the lower SVC. 4. Persistent low lung volumes with patchy left upper lobe airspace opacity suspicious for pneumonia. Electronically Signed   By: Keith Rake M.D.   On: 13-May-2021 14:55   DG Chest Port 1 View  Result Date: 05-13-2021 CLINICAL DATA:  Questionable sepsis. EXAM: PORTABLE CHEST 1 VIEW COMPARISON:  03/21/2021. FINDINGS: Patient is rotated to the right. Borderline cardiomegaly. No pulmonary venous congestion. Questionable right perihilar density. This may be secondary to overlapping vessels. A follow-up PA and lateral chest x-ray is suggested for further evaluation. Biapical pleural-parenchymal thickening consistent scarring. Left apical infiltrate cannot be excluded. This can also be further evaluated on follow-up exam. Hiatal hernia  noted. Degenerative change thoracic spine. IMPRESSION: 1. Questionable right perihilar density. This may be secondary to overlapping vessels. Follow-up PA lateral chest x-ray suggested for further evaluation. 2. Biapical pleural-parenchymal thickening consistent scarring. Left apical infiltrate cannot be excluded. This can also be further evaluated on follow-up exam. 3.  Borderline cardiomegaly. 4.  Hiatal hernia. Electronically Signed   By: Marcello Moores  Register   On: 05-13-21 13:34    My personal review of EKG: Rhythm NSR, Rate 103 /min, QTc 510, right bundle branch block  Assessment & Plan:    Active Problems:   Personal history of colonic polyps   Memory disorder   Closed left hip fracture (HCC)   HLD (hyperlipidemia)   Dementia without behavioral disturbance (HCC)   Leukocytosis   Severe sepsis with septic shock (HCC)   AKI (acute kidney injury) (HCC)   CKD (chronic  kidney disease), stage III (HCC)   Cardiogenic shock (HCC)   Lactic acidosis   Acute metabolic encephalopathy  Multiorgan failure Acute hypoxic respiratory failure Acute metabolic encephalopathy Acute DVT, high clinical suspicion for acute PE Cardiogenic shock Septic shock present on admission Coagulopathy severe lactic acidosis AKI on CKD Transaminitis Coagulopathy UTI Prolonged QTC Hypothermia Pneumonia Dementia Severe deconditioning Severe  Combined metabolic and respiratory acidosis   Presents with shock, acute respiratory failure, requiring immediate intubation, she is currently on full vent support, hypotensive on pressor support, her work-up significant for multiorgan failure, including renal, respiratory, neurological and hematologic with coagulopathy, with elevated LFTs as well, work-up significant for increased RV pressure by critical care physician while in ED, likely due to PE, and acute DVT, for which she started on heparin GTT, she is profoundly hypotensive, on maximum pressor support on Levophed at  30 mics, patient broad-spectrum antibiotics for presumed pneumonia and UTI, on IV fluids, she is with severe lactic acidosis, is getting aggressive IV hydration, and her sepsis is being treated as well.  She is currently on full vent support as well.  Profound altered neurological status, which she is totally unresponsive off sedation.  He remains hypothermic on bear hugger as well. -Patient is currently profoundly encephalopathic, remains hypotensive, mottled, with nonpalpable peripheral pulses, only can feel her femoral pulses by Dopplers, I have spoke to the family including sister, niece, brother-in-law, and discussed with husband by phone (who is currently in SNF), I have informed them that prognosis is very grim, and she is unlikely to survive this, given that she is more alert, with blood pressure in the 30s, and they do understand, she is DNR, and they were considering withdrawal of care decision, but for now we will continue with current measures, no escalation of care, which she is DNR.   DVT Prophylaxis Heparin GTT  AM Labs Ordered, also please review Full Orders  Family Communication: Admission, patients condition and plan of care including tests being ordered have been discussed with multiple family members including sister, niece, brother-in-law bedside and discussed with husband by phone who indicate understanding and agree with the plan and Code Status.  Code Status DNR  Likely DC to  : We will be admitted to ICU, prognosis is extremely poor  Condition guarded  Consults called: PCCM    Admission status: inpatient    Time spent in minutes : 65 minutes  60 minutes of critical care time has been spent in this patient, with multiorgan failure, cardiorespiratory status, septic shock, coagulopathy.   Phillips Climes M.D on 05-20-21 at 5:19 PM   [facility] - Office  [phone]

## 2021-05-03 NOTE — Procedures (Signed)
Extubation Procedure Note  Patient Details:   Name: Diane Wise DOB: 02-13-1941 MRN: [id number]   Airway Documentation:    Vent end date: (05/02/21) Vent end time: (1943)   Evaluation  O2 sats: currently acceptable Complications: No apparent complications Patient did tolerate procedure well. Bilateral Breath Sounds: Diminished   No   Patient terminally extubated per Dr. Waldron Labs to room air. Family brought back to bedside after procedure was over.  Eliane Decree 05/02/21, 7:47 PM

## 2021-05-03 NOTE — Consult Note (Signed)
NAME:  Diane Wise, MRN:  [id number], DOB:  [birth year], LOS: 0 ADMISSION DATE:  05-08-2021, CONSULTATION DATE:  2021-05-08  REFERRING MD:  Almyra Free, EDP , CHIEF COMPLAINT: Shock, intubated  History of Present Illness:  80 year old nursing home resident due to advanced dementia brought in by EMS with shortness of breath for 1 day, transported on nonrebreather very pale and cyanotic on arrival and immediately intubated, patient noted to be mottled, hypothermic to 92 degrees, given 1.7 L of fluid for hypotension, started on Levophed, right IJ central line placed by ED.  PCCM consulted with presumed diagnosis of septic shock She had a recent admission for left hip fracture following a fall and underwent partial left hip replacement on 03/23/2021 Initial lactate was more than 11, labs showed acute renal failure with leukocytosis 24K, INR was 2.2  Pertinent  Medical History  Advanced dementia Left hip replacement 03/23/2021  Significant Hospital Events: Including procedures, antibiotic start and stop dates in addition to other pertinent events   . ETT 5/25 . RIJ 5/25  Interim History / Subjective:    Objective   Blood pressure (!) 31/15, pulse 99, temperature (!) 91.9 F (33.3 C), resp. rate 16, height 5\' 4"  (1.626 m), weight 55.8 kg, SpO2 91 %.    Vent Mode: PRVC FiO2 (%):  [100 %] 100 % Set Rate:  [18 bmp] 18 bmp PEEP:  [5 cmH20] 5 cmH20 Plateau Pressure:  [14 cmH20] 14 cmH20   Intake/Output Summary (Last 24 hours) at May 08, 2021 1555 Last data filed at 05-08-2021 1425 Gross per 24 hour  Intake 1950.37 ml  Output --  Net 1950.37 ml   Filed Weights   2021-05-08 1143  Weight: 55.8 kg    Examination: General: Elderly woman, acutely ill, orally intubated HENT: Cyanotic, pale, no JVD Lungs: Bilateral equal breath sounds, no accessory muscle use Cardiovascular: S1-S2 regular, sinus on monitor, palpable femoral pulses Abdomen: Soft, nontender Extremities: Mottled, cyanotic Neuro:  Unresponsive GU: No urine  Labs/imaging that I havepersonally reviewed  (right click and "Reselect all SmartList Selections" daily)  Leukocytosis, acute renal failure with BUN/creatinine 84/3.0, lactate more than 11 VBG shows metabolic acidosis  Chest x-ray independently reviewed shows right IJ and ET tube in position, rotated film, patchy airspace disease left upper lobe and right hilar  Resolved Hospital Problem list     Assessment & Plan:  Shock, could be septic with H CAP as probable source but not convincing. Bedside echo performed which shows dilated RV and that raises the question of possible PE. While I was evaluating patient, blood pressure dropped further and Levophed had to be increased to 30 mics.  ABG could not be obtained due to weak pulses.  Discussed with family present including her sister and niece who agreed that patient would not have wanted this level of intervention.  EDP had previously spoken to patient's husband Diane Wise.  I called him again and explained poor prognosis regardless of etiology of shock.  He agreed that we should not subject Diane Wise to CPR, limited code orders were issued   Shock, septic versus obstructive -We will start IV heparin and obtain venous duplex and echo -Given overall poor prognosis, advanced dementia, coagulopathy we will not administer tPA at this time -Agree with broad-spectrum antibiotics to cover HAP  Acute respiratory failure -vent settings were reviewed and adjusted.  AKI -fluids, monitor, treat acidosis empirically  Goals of care discussion as above.  During evaluation blood pressure dropped to the 30s.  I do not think  that she will survive this.  Sister and niece expressed possibility of withdrawal of life support.  We will have this conversation with Diane Wise again once they have had a chance to communicate with him   Best practice (right click and "Reselect all SmartList Selections" daily)  Diet:  NPO Pain/Anxiety/Delirium  protocol (if indicated): Yes (RASS goal 0) VAP protocol (if indicated): Yes DVT prophylaxis: Systemic AC GI prophylaxis: PPI Glucose control:  SSI Yes Central venous access:  Yes, and it is still needed Arterial line:  N/A Foley:  Yes, and it is still needed Mobility:  bed rest  PT consulted: N/A Last date of multidisciplinary goals of care discussion [5/25] Code Status:  limited Disposition: ICU  Labs   CBC: Recent Labs  Lab 05-22-2021 1153  WBC 24.1*  NEUTROABS 17.4*  HGB 13.4  HCT 46.0  MCV 96.8  PLT 267    Basic Metabolic Panel: Recent Labs  Lab 2021-05-22 1153  NA 138  K 5.0  CL 101  CO2 11*  GLUCOSE 242*  BUN 84*  CREATININE 3.00*  CALCIUM 8.9   GFR: Estimated Creatinine Clearance: 12.7 mL/min (A) (by C-G formula based on SCr of 3 mg/dL (H)). Recent Labs  Lab May 22, 2021 1153  WBC 24.1*  LATICACIDVEN >11*    Liver Function Tests: Recent Labs  Lab 05/22/2021 1153  AST 65*  ALT 63*  ALKPHOS 261*  BILITOT 1.3*  PROT 7.0  ALBUMIN 2.9*   No results for input(s): LIPASE, AMYLASE in the last 168 hours. No results for input(s): AMMONIA in the last 168 hours.  ABG    Component Value Date/Time   PHART <6.800 (LL) 22-May-2021 1505   PCO2ART 60.0 (H) 05-22-2021 1505   PO2ART NOT CALCULATED 05/22/2021 1505   HCO3 NOT CALCULATED 2021/05/22 1505   ACIDBASEDEF 20.6 (H) 05/22/21 1230   O2SAT 33.6 May 22, 2021 1505     Coagulation Profile: Recent Labs  Lab 05/22/2021 1210  INR 2.2*    Cardiac Enzymes: No results for input(s): CKTOTAL, CKMB, CKMBINDEX, TROPONINI in the last 168 hours.  HbA1C: No results found for: HGBA1C  CBG: No results for input(s): GLUCAP in the last 168 hours.  Review of Systems:   Unable to obtain since unresponsive, intubated  Past Medical History:  She,  has a past medical history of Chronic kidney disease, Complication of anesthesia, GERD (gastroesophageal reflux disease), Hyperlipidemia, Memory disorder (07/31/2018), and  Osteoporosis.   Surgical History:   Past Surgical History:  Procedure Laterality Date  . ABDOMINAL HYSTERECTOMY  1984  . APPENDECTOMY  05/20/13  . HIP ARTHROPLASTY Left 03/23/2021   Procedure: PARTIAL LEFT HIP REPLACEMENT, BIPOLAR;  Surgeon: Carole Civil, MD;  Location: [facility];  Service: Orthopedics;  Laterality: Left;  . LAPAROSCOPIC APPENDECTOMY N/A 05/20/2013   Procedure: APPENDECTOMY LAPAROSCOPIC;  Surgeon: Leighton Ruff, MD;  Location: [facility];  Service: General;  Laterality: N/A;  . TONSILLECTOMY       Social History:   reports that she has never smoked. She has never used smokeless tobacco. She reports that she does not drink alcohol and does not use drugs.   Family History:  Her family history includes Cancer in her father; Diabetes in her brother and sister.   Allergies Allergies  Allergen Reactions  . Codeine Nausea And Vomiting  . Sulfa Antibiotics Nausea And Vomiting     Home Medications  Prior to Admission medications   Medication Sig Start Date End Date Taking? Authorizing Provider  acetaminophen (TYLENOL) 325 MG tablet Take  2 tablets (650 mg total) by mouth every 4 (four) hours as needed. 2/58/52  Yes Leighton Ruff, MD  aspirin EC 325 MG EC tablet Take 1 tablet (325 mg total) by mouth daily with breakfast. 03/28/21  Yes Emokpae, Courage, MD  atorvastatin (LIPITOR) 10 MG tablet Take 10 mg by mouth daily.   Yes [provider]  cyanocobalamin 100 MCG tablet Take 100 mcg by mouth daily.   Yes [provider]  donepezil (ARICEPT) 5 MG tablet Take 1 tablet (5 mg total) by mouth at bedtime. 07/31/18  Yes Kathrynn Ducking, MD  mirtazapine (REMERON) 7.5 MG tablet Take 1 tablet (7.5 mg total) by mouth at bedtime. 03/27/21  Yes Roxan Hockey, MD  Multiple Vitamin (MULTIVITAMIN) capsule Take 1 capsule by mouth daily.   Yes [provider]  omeprazole (PRILOSEC OTC) 20 MG tablet Take 1 tablet (20 mg total) by mouth daily. 03/27/21 03/27/22 Yes  Emokpae, Courage, MD  ondansetron (ZOFRAN) 4 MG tablet Take 1 tablet (4 mg total) by mouth every 6 (six) hours as needed for nausea. 03/27/21  Yes Emokpae, Courage, MD  QUEtiapine (SEROQUEL) 25 MG tablet Take 1,205 mg by mouth at bedtime as needed (for sleep/anxiety).   Yes [provider]  senna-docusate (SENOKOT-S) 8.6-50 MG tablet Take 2 tablets by mouth at bedtime. 03/27/21  Yes Emokpae, Courage, MD  sodium chloride (OCEAN) 0.65 % nasal spray Place 1 spray into the nose daily as needed for congestion.   Yes [provider]  traMADol (ULTRAM) 50 MG tablet Take 1 tablet (50 mg total) by mouth every 6 (six) hours. 03/27/21  Yes Emokpae, Courage, MD  LORazepam (ATIVAN) 0.5 MG tablet Take 1 tablet (0.5 mg total) by mouth every 8 (eight) hours as needed for anxiety, sedation or sleep. Patient not taking: No sig reported 03/27/21   Roxan Hockey, MD     Critical care time: 43 m     Kara Mead MD. [facility]. [place] [facility] Pager : [phone]  If no response to pager , please call [phone] until 7 pm After 7:00 pm call Elink  [phone]   19-May-2021

## 2021-05-03 NOTE — Progress Notes (Signed)
I have discussed with multiple family members, including both nieces, sister, brother-in-law, they have discussed with husband as well, at this point family want to proceed with comfort measures, withdrawal of care, reports she would not want to be kept alive on life support, this is very appropriate, given very clean prognosis, patient is actually dying, obese measures along her discomfort, no meaningful prognosis, so we will proceed with withdrawal of care, will start on morphine drip, will DC pressors first, then ventilator support. Phillips Climes MD

## 2021-05-03 NOTE — ED Notes (Addendum)
Pt asystole on monitor, md at bedside checking for pulse with doppler, no pulse found, pt pronounced at 1958 by MD Elgergawy

## 2021-05-03 NOTE — ED Notes (Signed)
Pt placed on bear hugger.  

## 2021-05-03 NOTE — ED Notes (Signed)
intensivist at bedside doing bedside echo

## 2021-05-03 NOTE — ED Notes (Signed)
Date and time results received: 05/21/21  (use smartphrase ".now" to insert current time)  Test: ABG Critical Value: ph >6.80  Name of Provider Notified: Hong  Orders Received? Or Actions Taken?: pt made CC

## 2021-05-03 NOTE — ED Triage Notes (Signed)
Pt brought in by [facility] from [place] with c/o SOB since yesterday. EMS reports RR of 50 per minute. Pt on NRB and very pale. EMS reports pt was blue around the lips when they arrived.

## 2021-05-03 NOTE — ED Notes (Signed)
Date and time results received: 05-23-2021 1315   Test: lactate Critical Value: greater than 11  Name of Provider Notified: [place]

## 2021-05-03 NOTE — ED Notes (Signed)
Unable to get blood pressure at this time, pt has thready corroid pulse, intensivist notified, to increase norepi to 20

## 2021-05-03 NOTE — Progress Notes (Signed)
Notified bedside nurse of need to draw lactic acid.  

## 2021-05-03 NOTE — ED Notes (Signed)
Pt in bed, pt cold to the touch, pt mottled, pt placed on cardiac monitor and O2 sat monitor, O2 sat probe will not read, attempted alternate fingers, ear and forehead, RT at bedside attempting ABG.

## 2021-05-03 DEATH — deceased

## 2021-11-15 IMAGING — DX DG PORTABLE PELVIS
2 series · 2 of 2 positions shown · non-contrast
Comparison: 03/21/2021

CLINICAL DATA: Postop left hip replacement

EXAM:
PORTABLE PELVIS 1-2 VIEWS

[pelvis ap (1 of 2)]
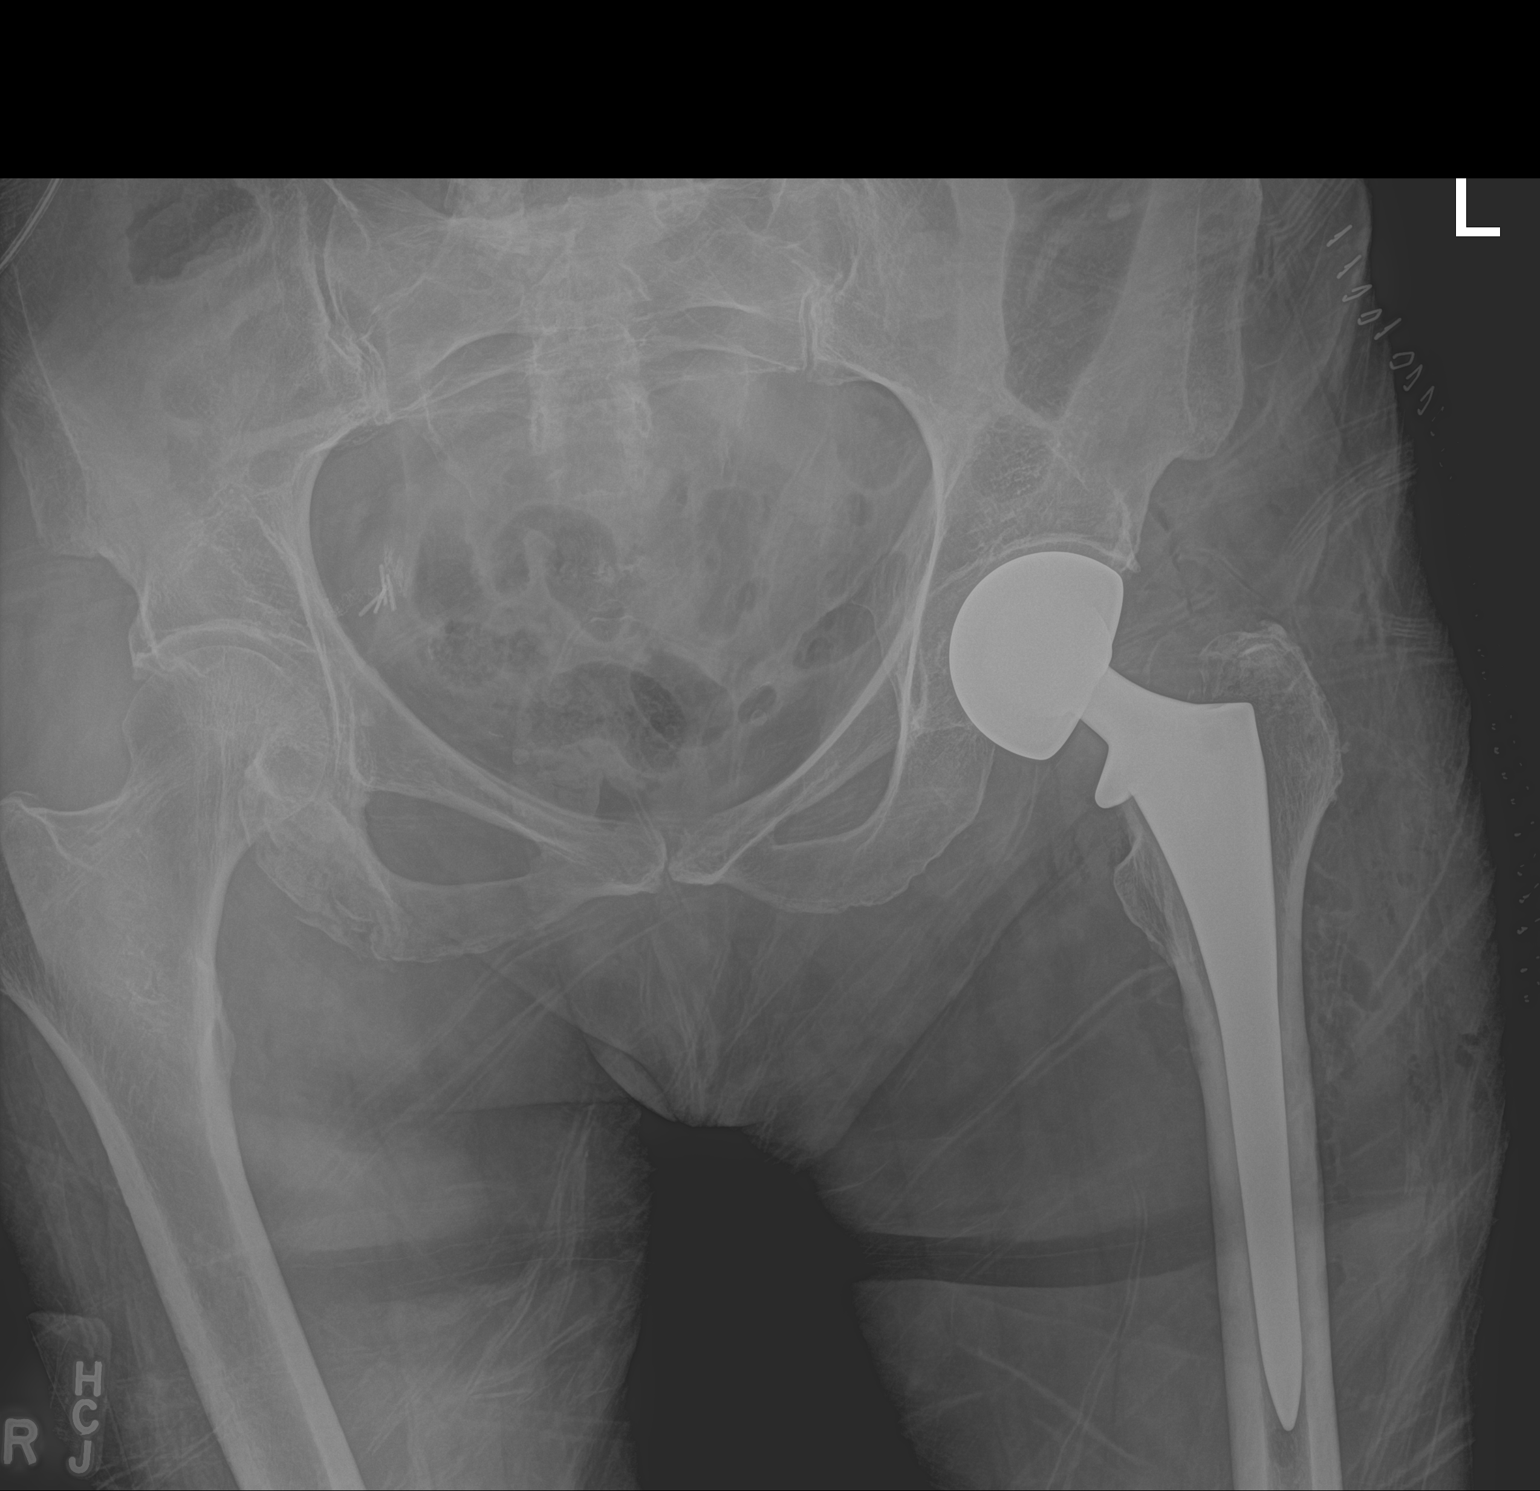

[pelvis ap (2 of 2)]
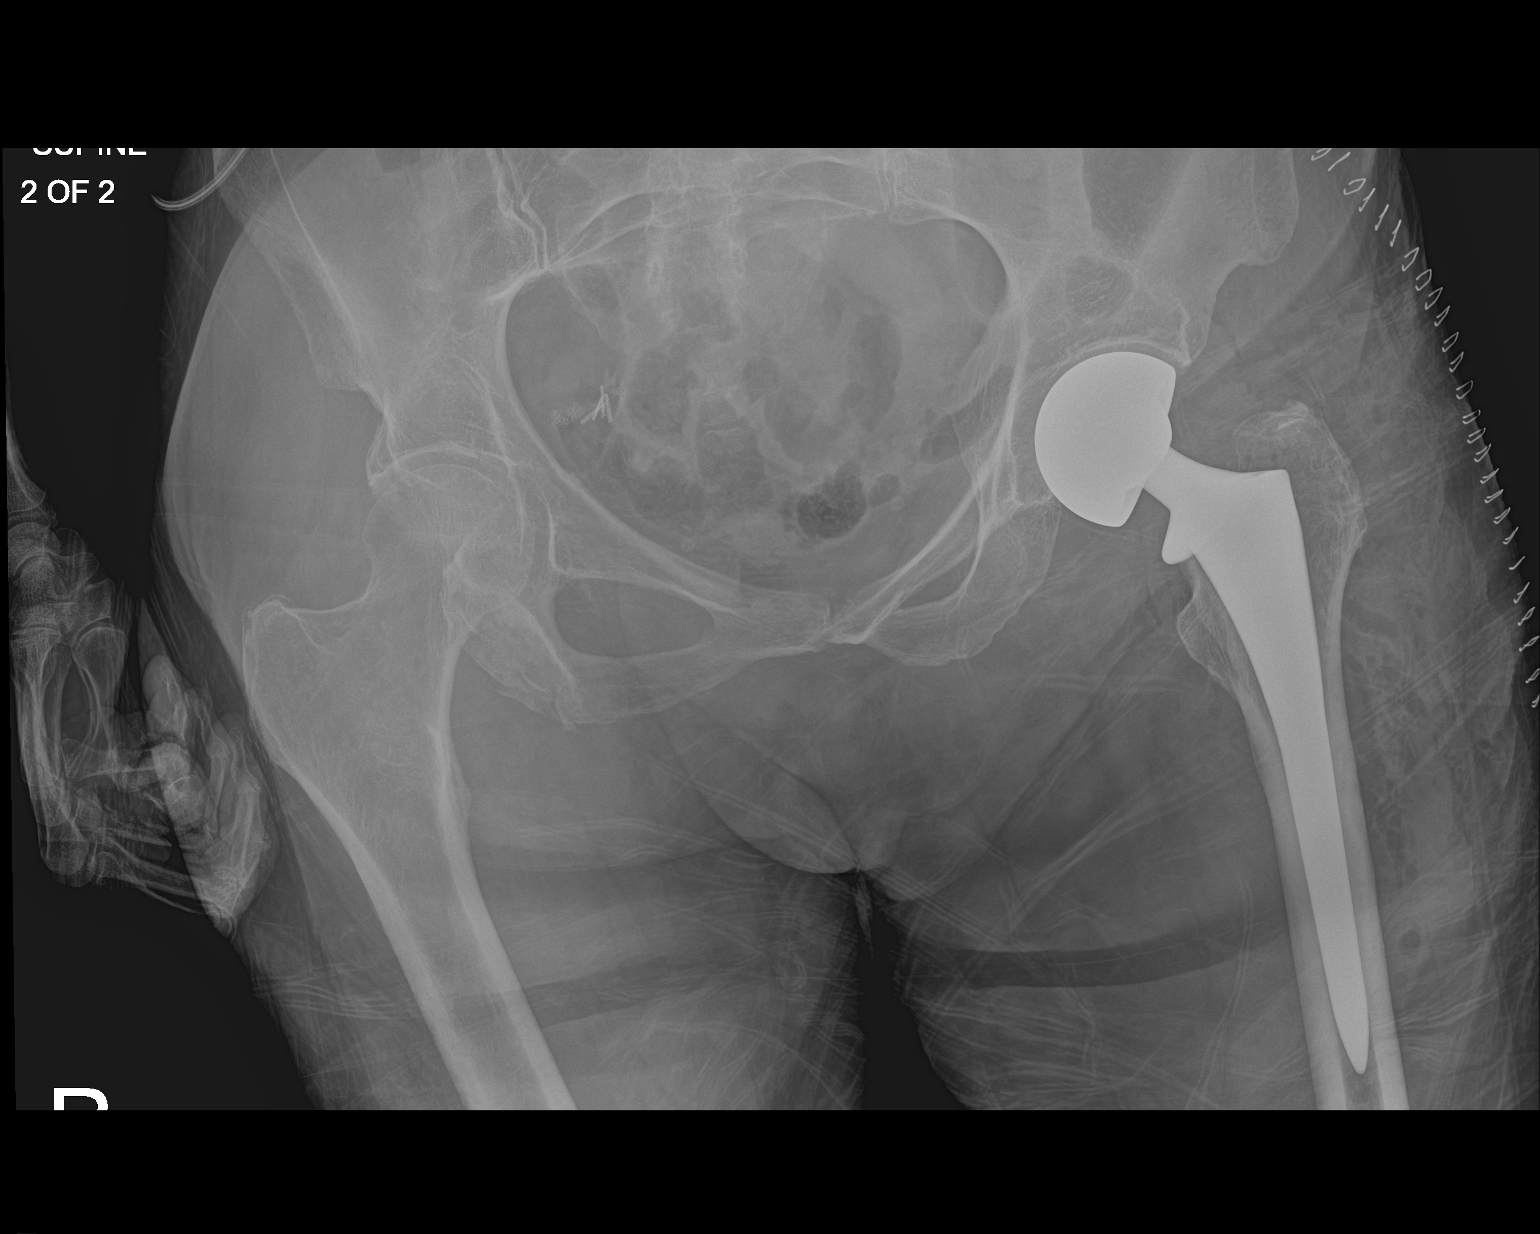

[2 of 2 positions shown; findings below may reference images not displayed]

FINDINGS: Changes of left hip replacement. Normal AP alignment. No hardware
bony complicating feature.
IMPRESSION: Left hip replacement.  No visible complicating feature.

## 2021-12-19 IMAGING — DX DG CHEST 1V PORT
1 series · 1 of 1 positions shown · non-contrast
Comparison: 03/21/2021.

CLINICAL DATA: Questionable sepsis.

EXAM:
PORTABLE CHEST 1 VIEW

[chest ap]
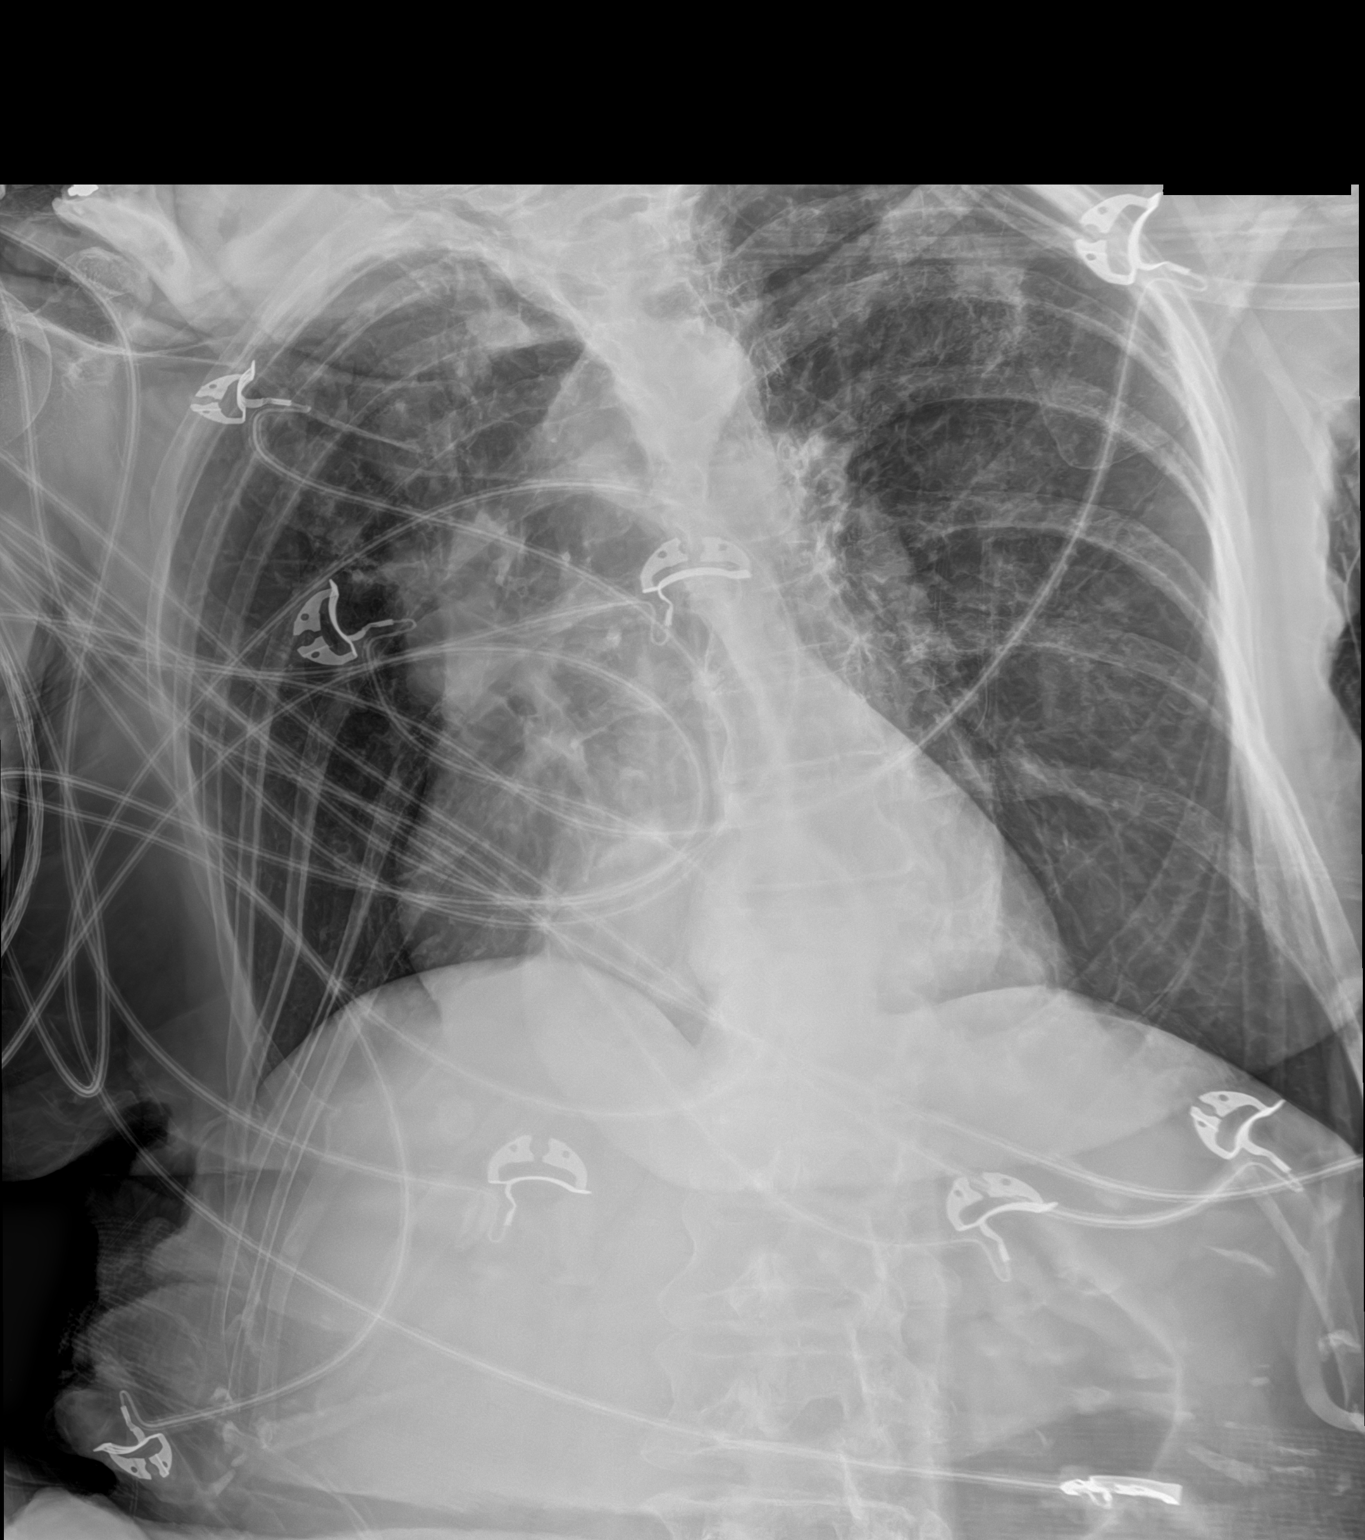

[1 of 1 positions shown; findings below may reference images not displayed]

FINDINGS: Patient is rotated to the right. Borderline cardiomegaly. No
pulmonary venous congestion. Questionable right perihilar density.
This may be secondary to overlapping vessels. A follow-up PA and
lateral chest x-ray is suggested for further evaluation. Biapical
pleural-parenchymal thickening consistent scarring. Left apical
infiltrate cannot be excluded. This can also be further evaluated on
follow-up exam. Hiatal hernia noted. Degenerative change thoracic
spine.
IMPRESSION: 1. Questionable right perihilar density. This may be secondary to
overlapping vessels. Follow-up PA lateral chest x-ray suggested for
further evaluation.

2. Biapical pleural-parenchymal thickening consistent scarring. Left
apical infiltrate cannot be excluded. This can also be further
evaluated on follow-up exam.

3.  Borderline cardiomegaly.

4.  Hiatal hernia.

## 2021-12-19 IMAGING — US US EXTREM LOW VENOUS
1 series · 12 of 24 positions shown · non-contrast
Comparison: None.

CLINICAL DATA: 79-year-old female with concern for DVT.



[Series 1: us venous img lower bilat (dvt) · portal-venous · 70 acquisitions, 12 frames shown]
[im 4/70]
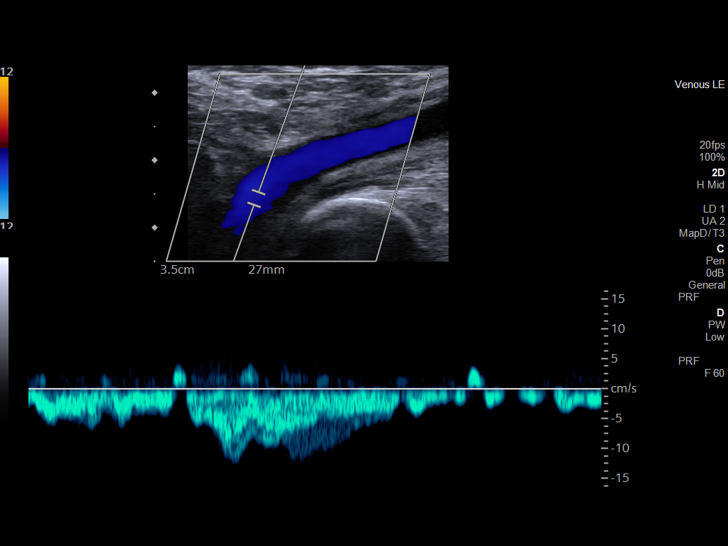
[im 10/70]
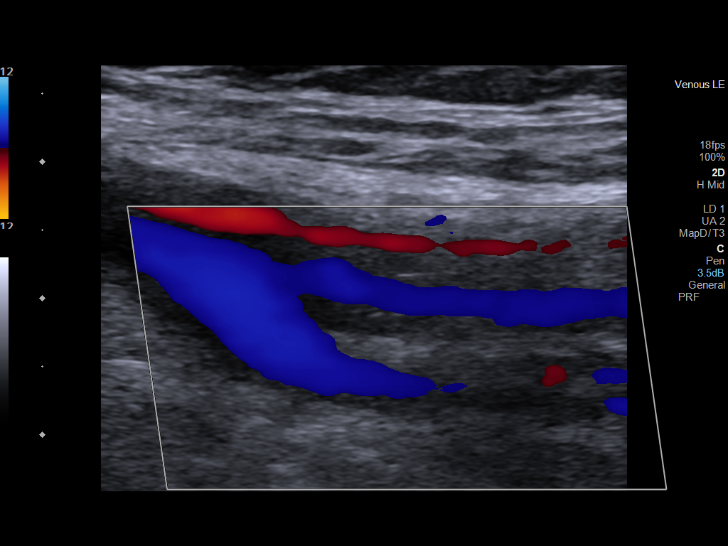
[im 16/70]
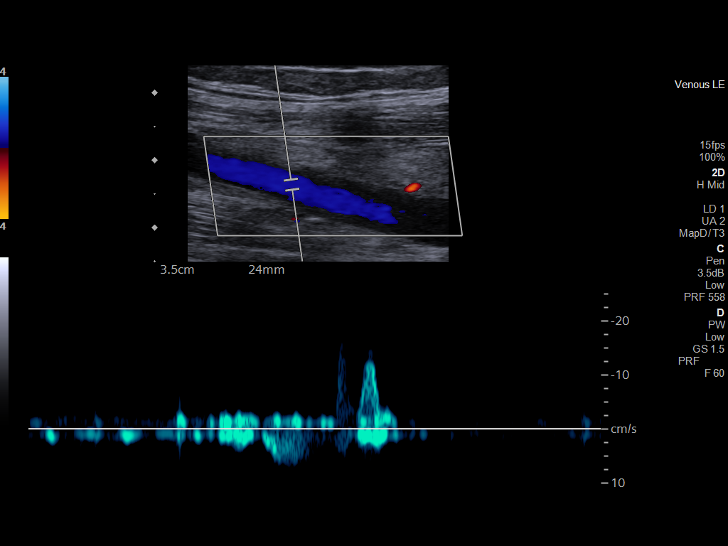
[im 22/70]
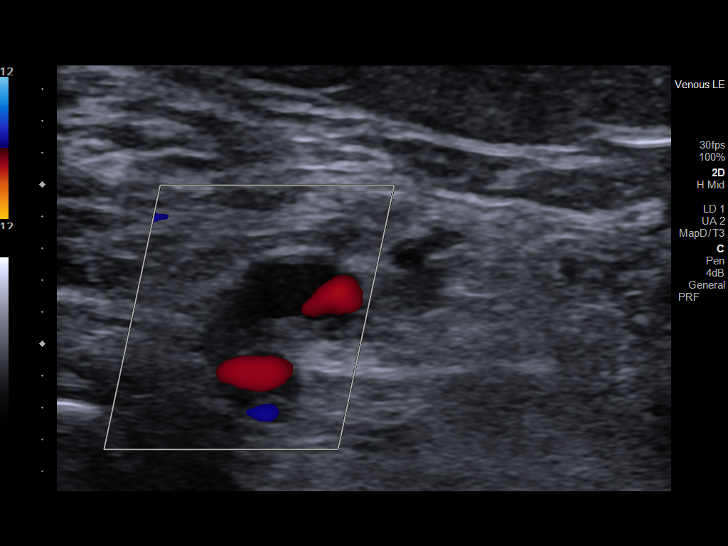
[im 28/70]
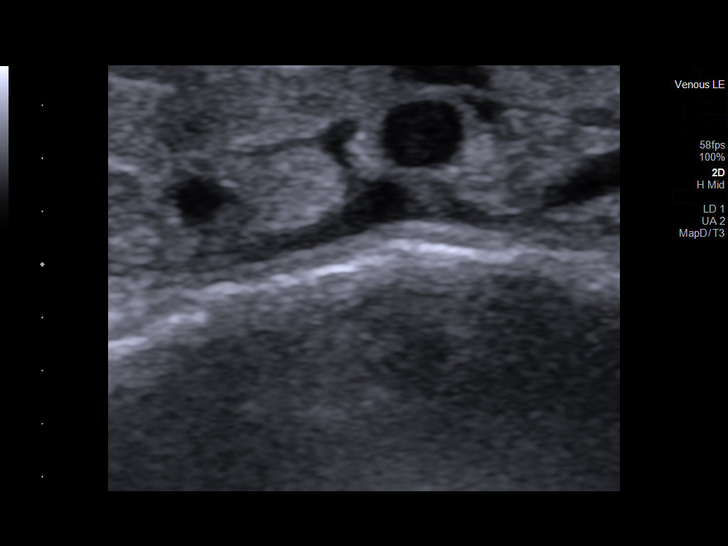
[im 31/70]
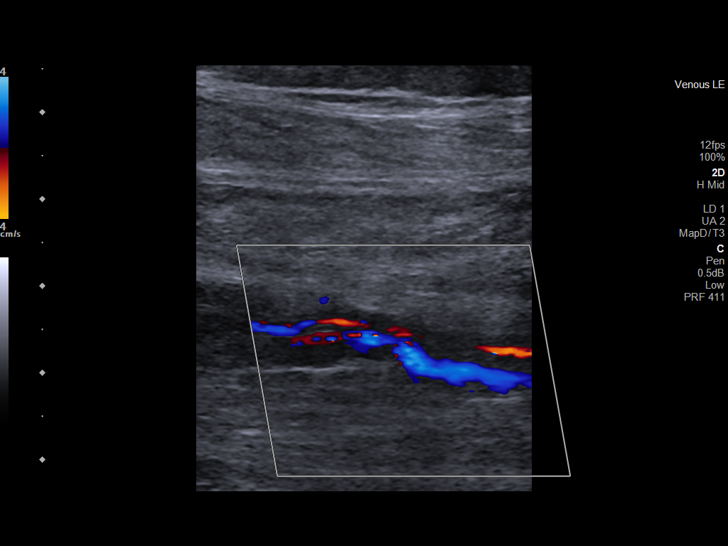
[im 40/70]
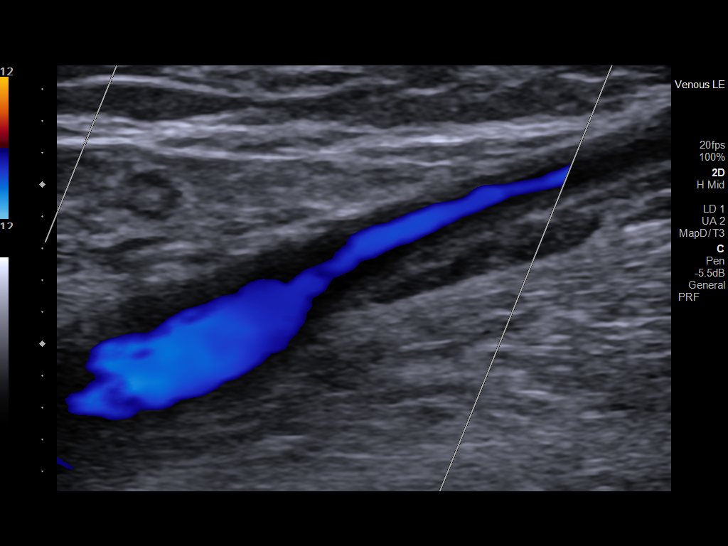
[im 46/70]
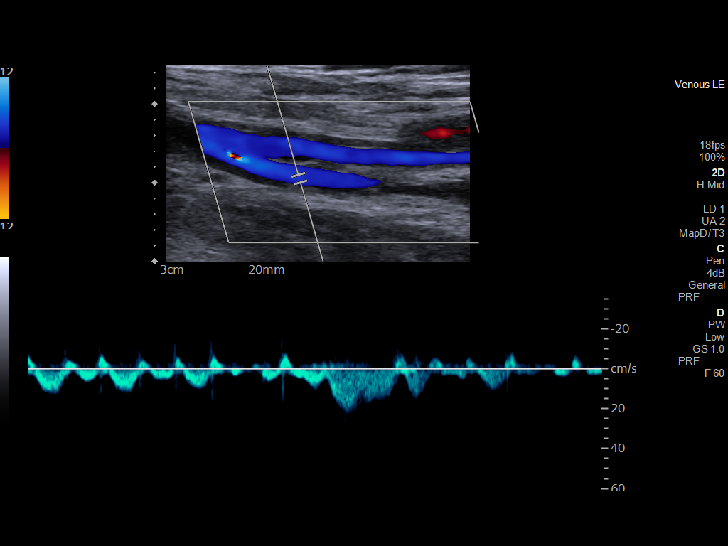
[im 52/70]
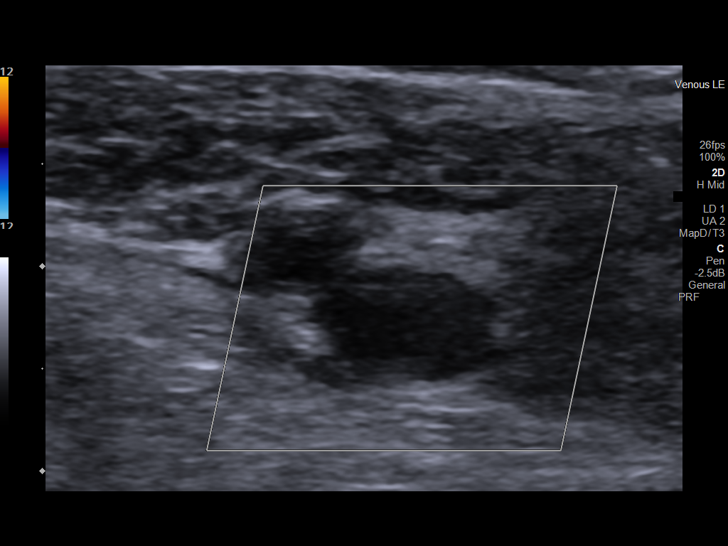
[im 58/70]
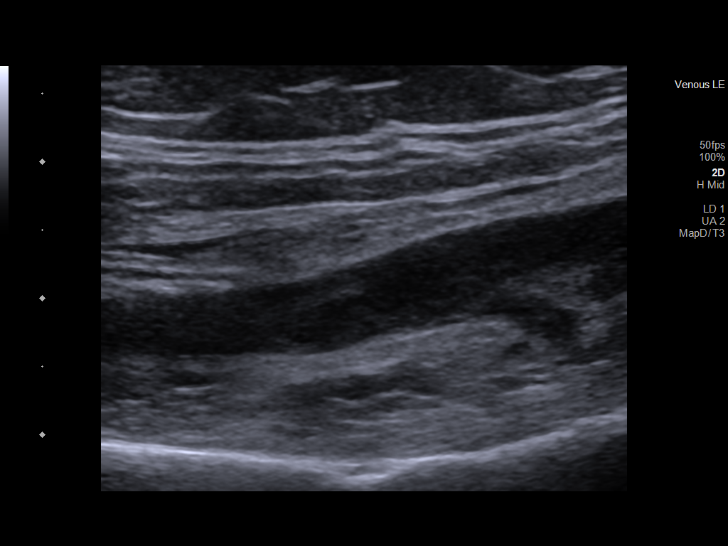
[im 64/70]
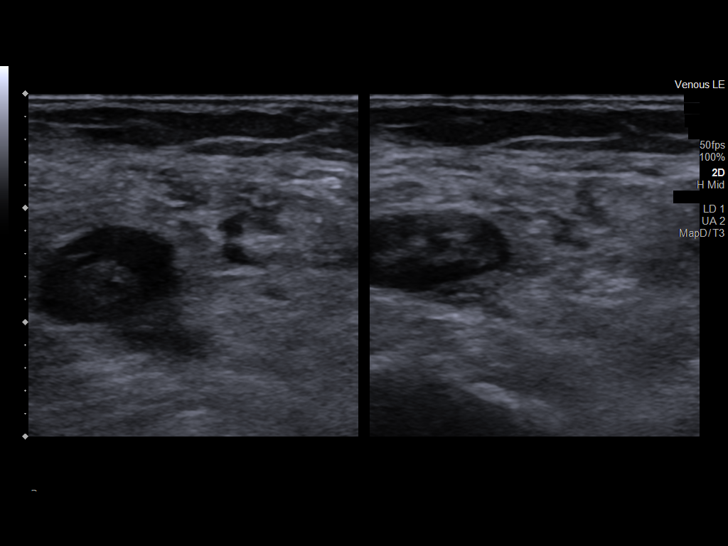
[im 70/70]
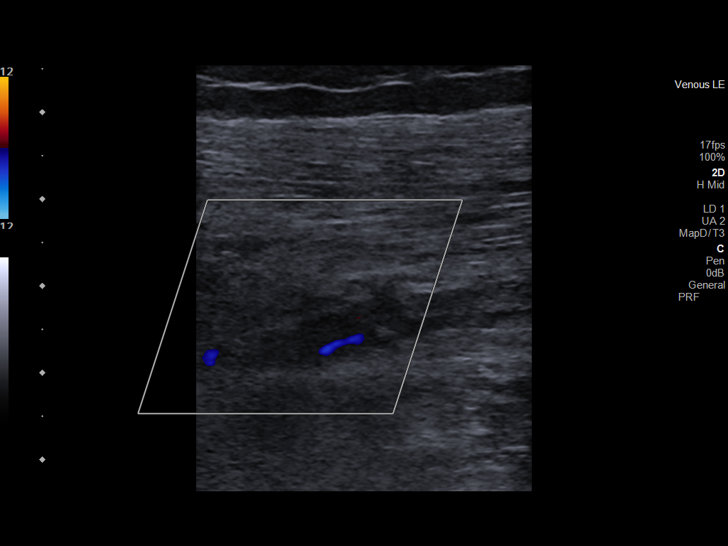

[12 of 24 positions shown; findings below may reference images not displayed]

FINDINGS: RIGHT LOWER EXTREMITY

Common Femoral Vein: No evidence of thrombus. Normal
compressibility, respiratory phasicity and response to augmentation.

Saphenofemoral Junction: No evidence of thrombus. Normal
compressibility and flow on color Doppler imaging.

Profunda Femoral Vein: No evidence of thrombus. Normal
compressibility and flow on color Doppler imaging.

Femoral Vein: No evidence of acute thrombus. There is thickened
appearance of the vessel wall with narrowing of the lumen which may
represent scarring related to prior thrombus.

Popliteal Vein: There is thrombus within the popliteal vein with
occlusion of the majority of the lumen. This is of indeterminate
chronicity but an acute DVT is not excluded. There is thickened
appearance of the vessel wall.

Calf Veins: No evidence of thrombus. Normal compressibility and flow
on color Doppler imaging.

Other Findings: There is diffuse subcutaneous edema of the distal
lower extremity.

LEFT LOWER EXTREMITY

Common Femoral Vein: There is diffusely thickened vessel wall with
narrowing of the lumen, likely related to chronic thrombus and
scarring. The vessel remains patent.

Saphenofemoral Junction: Diffusely thickened vessel wall with
luminal narrowing, likely related to chronic DVT and scarring. The
saphenofemoral junction remains patent.

Profunda Femoral Vein: No evidence of thrombus. Normal
compressibility and flow on color Doppler imaging.

Femoral Vein: There is occlusive thrombus in the distal femoral
vein.

Popliteal Vein: There is occlusive thrombus in the popliteal vein.

Calf Veins: There is large thrombus in the gastrocnemius vein. The
visualized posterior tibial and peroneal veins appear patent.
IMPRESSION: 1. Occlusive thrombus in the left distal femoral vein and popliteal
vein.
2. Occlusive thrombus in the left gastrocnemius vein.
3. Right popliteal vein thalamus with occlusion of the majority of
the lumen.
4. Probable chronic changes and scarring of the left common femoral
vein as well as the right femoral vein.

These results were called by telephone at the time of interpretation
on 04/26/2021 at [DATE] to Ikue, Sunag[place] who verbally acknowledged
these results.

## 2021-12-19 IMAGING — DX DG CHEST 1V PORT
1 series · 1 of 1 positions shown · non-contrast
Comparison: Radiograph earlier today.

CLINICAL DATA: Post intubation.

EXAM:
PORTABLE CHEST 1 VIEW

[chest ap]
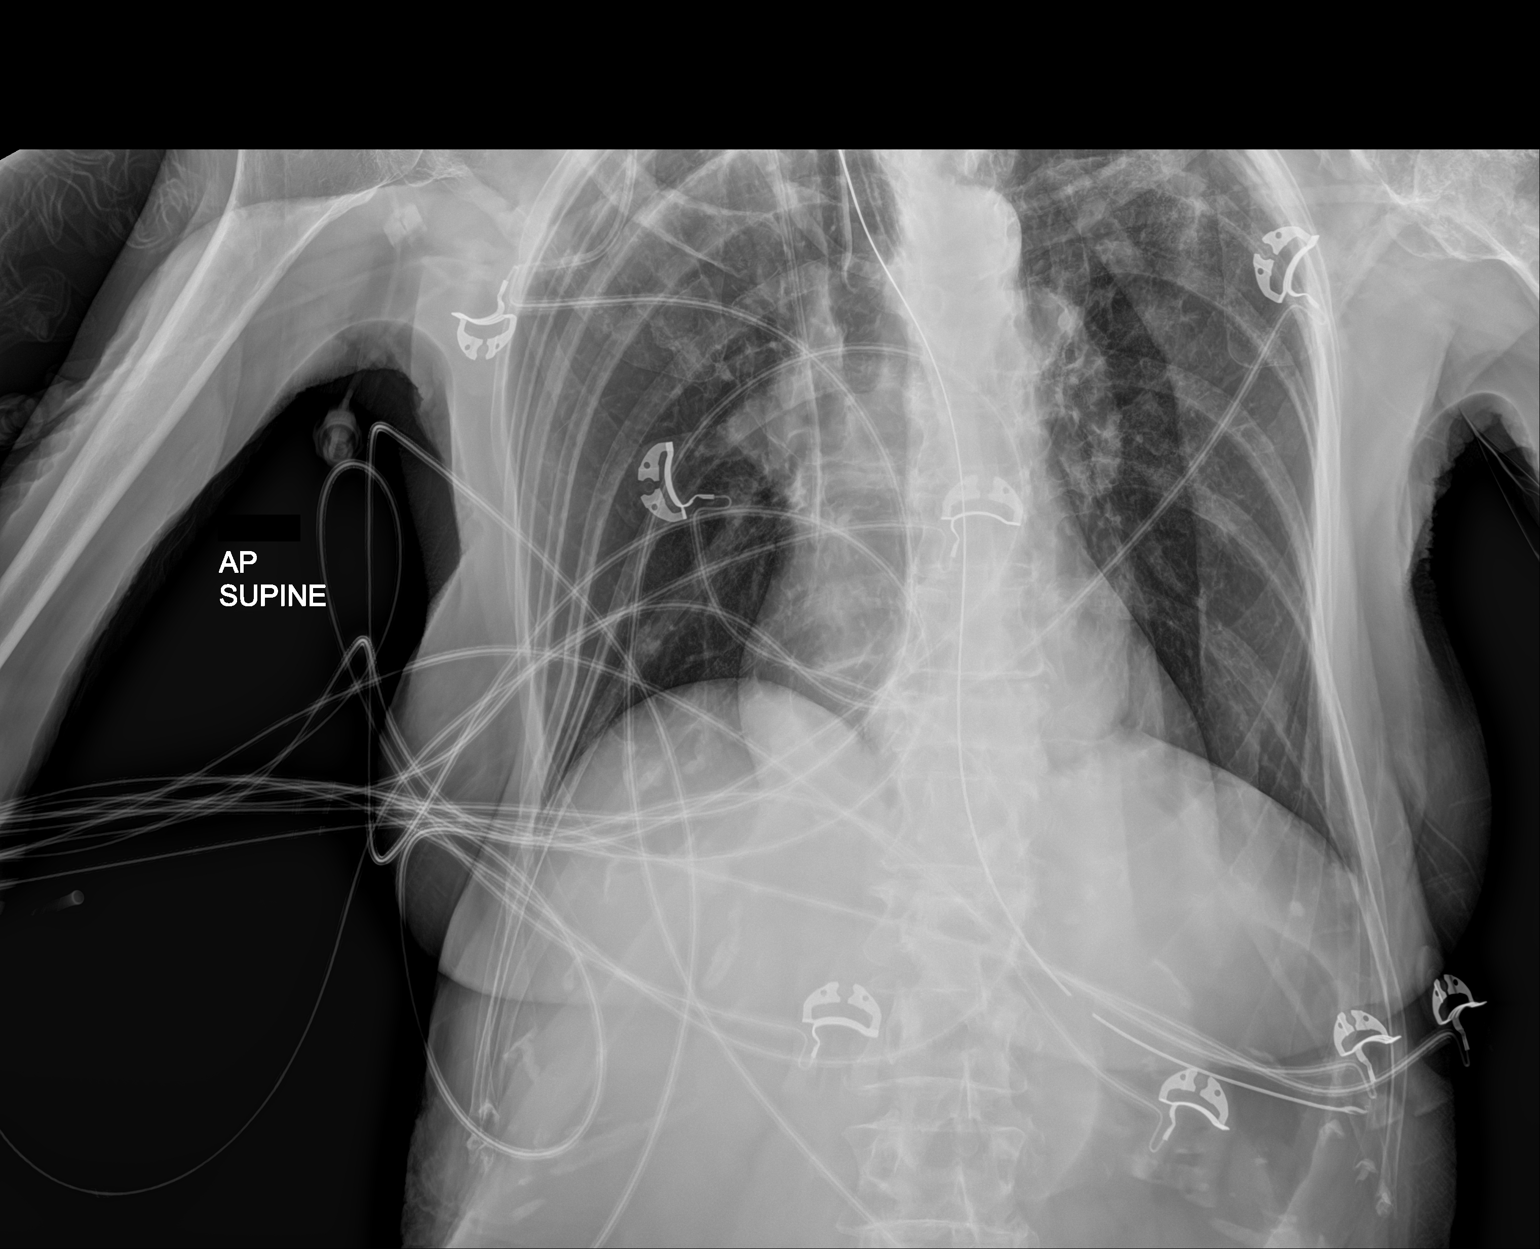

[1 of 1 positions shown; findings below may reference images not displayed]

FINDINGS: Rotated exam. Endotracheal tube tip at the level of clavicles 1.9 cm
from the carina. Tip and side port of the enteric tube below the
diaphragm in the stomach. There is a right internal jugular central
venous catheter tip in the region of the lower SVC. Persistent low
lung volumes. Stable heart size and mediastinal contours with right
hilar prominence, possibly due to rotation. Patchy airspace opacity
in the left upper lobe appears similar to earlier today. There is
biapical pleuroparenchymal scarring. No pneumothorax or large
pleural effusion. Bones under mineralized. Chronic left shoulder
arthropathy. Hiatal hernia on prior exam not well appreciated.
IMPRESSION: 1. Endotracheal tube tip 1.9 cm from the carina.
2. Enteric tube tip and side-port below the diaphragm in the
stomach.
3. Right internal jugular central venous catheter tip in the lower
SVC.
4. Persistent low lung volumes with patchy left upper lobe airspace
opacity suspicious for pneumonia.
